# Patient Record
Sex: Male | Born: 2000 | Race: Black or African American | Hispanic: No | Marital: Single | Smoking: Never smoker
Health system: Southern US, Community
[De-identification: ages and names within clinical notes are randomized; demographics above are authoritative.]

## PROBLEM LIST (undated history)

## (undated) DIAGNOSIS — R7303 Prediabetes: Secondary | ICD-10-CM

## (undated) DIAGNOSIS — J45909 Unspecified asthma, uncomplicated: Secondary | ICD-10-CM

## (undated) DIAGNOSIS — G35D Multiple sclerosis, unspecified: Secondary | ICD-10-CM

## (undated) DIAGNOSIS — G35 Multiple sclerosis: Secondary | ICD-10-CM

## (undated) HISTORY — DX: Prediabetes: R73.03

---
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
## 2019-02-20 ENCOUNTER — Ambulatory Visit: Payer: Managed Care, Other (non HMO) | Attending: Internal Medicine

## 2019-02-20 DIAGNOSIS — Z20822 Contact with and (suspected) exposure to covid-19: Secondary | ICD-10-CM

## 2019-02-22 LAB — NOVEL CORONAVIRUS, NAA: SARS-CoV-2, NAA: NOT DETECTED

## 2019-07-14 ENCOUNTER — Ambulatory Visit: Payer: Managed Care, Other (non HMO) | Attending: Internal Medicine

## 2019-07-14 DIAGNOSIS — Z23 Encounter for immunization: Secondary | ICD-10-CM

## 2019-07-14 NOTE — Progress Notes (Signed)
   Covid-19 Vaccination Clinic  Name:  Nathaniel Fowler    MRN: [id number] DOB: [birth year]  07/14/2019  Nathaniel Fowler was observed post Covid-19 immunization for 15 minutes without incident. He was provided with Vaccine Information Sheet and instruction to access the V-Safe system.   Nathaniel Fowler was instructed to call 911 with any severe reactions post vaccine: Marland Kitchen Difficulty breathing  . Swelling of face and throat  . A fast heartbeat  . A bad rash all over body  . Dizziness and weakness   Immunizations Administered    Name Date Dose VIS Date Route   Pfizer COVID-19 Vaccine 07/14/2019 11:47 AM 0.3 mL 04/05/2018 Intramuscular   Manufacturer: [employer], [employer]   Lot: [id number]   NDC: [address]-2

## 2019-08-05 ENCOUNTER — Ambulatory Visit: Payer: Managed Care, Other (non HMO) | Attending: Internal Medicine

## 2019-08-05 DIAGNOSIS — Z23 Encounter for immunization: Secondary | ICD-10-CM

## 2019-08-05 NOTE — Progress Notes (Signed)
   Covid-19 Vaccination Clinic  Name:  Nathaniel Fowler    MRN: [id number] DOB: 24-Jan-2001  08/05/2019  [name] was observed post Covid-19 immunization for 15 minutes without incident. He was provided with Vaccine Information Sheet and instruction to access the V-Safe system.   [name] was instructed to call 911 with any severe reactions post vaccine: Marland Kitchen Difficulty breathing  . Swelling of face and throat  . A fast heartbeat  . A bad rash all over body  . Dizziness and weakness   Immunizations Administered    Name Date Dose VIS Date Route   Pfizer COVID-19 Vaccine 08/05/2019 10:40 AM 0.3 mL 04/05/2018 Intramuscular   Manufacturer: [employer], [employer]   Lot: [id number]   NDC: [address]-3

## 2020-02-12 ENCOUNTER — Other Ambulatory Visit: Payer: Self-pay

## 2020-02-12 ENCOUNTER — Other Ambulatory Visit: Payer: Managed Care, Other (non HMO)

## 2020-02-12 DIAGNOSIS — Z20822 Contact with and (suspected) exposure to covid-19: Secondary | ICD-10-CM

## 2020-02-13 LAB — SARS-COV-2, NAA 2 DAY TAT

## 2020-02-13 LAB — NOVEL CORONAVIRUS, NAA: SARS-CoV-2, NAA: NOT DETECTED

## 2020-03-17 ENCOUNTER — Emergency Department
Admission: EM | Admit: 2020-03-17 | Discharge: 2020-03-17 | Disposition: A | Discharge status: Home / Self Care | Payer: Managed Care, Other (non HMO) | Attending: Emergency Medicine | Admitting: Emergency Medicine

## 2020-03-17 ENCOUNTER — Emergency Department: Payer: Managed Care, Other (non HMO)

## 2020-03-17 ENCOUNTER — Other Ambulatory Visit: Payer: Self-pay

## 2020-03-17 ENCOUNTER — Encounter: Payer: Self-pay | Admitting: Emergency Medicine

## 2020-03-17 DIAGNOSIS — J45909 Unspecified asthma, uncomplicated: Secondary | ICD-10-CM | POA: Diagnosis not present

## 2020-03-17 DIAGNOSIS — R202 Paresthesia of skin: Secondary | ICD-10-CM | POA: Insufficient documentation

## 2020-03-17 LAB — COMPREHENSIVE METABOLIC PANEL
ALT: 30 U/L (ref 0–44)
AST: 19 U/L (ref 15–41)
Albumin: 3.9 g/dL (ref 3.5–5.0)
Alkaline Phosphatase: 102 U/L (ref 38–126)
Anion gap: 9 (ref 5–15)
BUN: 11 mg/dL (ref 6–20)
CO2: 23 mmol/L (ref 22–32)
Calcium: 9.1 mg/dL (ref 8.9–10.3)
Chloride: 106 mmol/L (ref 98–111)
Creatinine, Ser: 1.04 mg/dL (ref 0.61–1.24)
GFR, Estimated: >60 mL/min (ref >60)
Glucose, Bld: 134 mg/dL — ABNORMAL HIGH (ref 70–99)
Potassium: 3.8 mmol/L (ref 3.5–5.1)
Sodium: 138 mmol/L (ref 135–145)
Total Bilirubin: 0.5 mg/dL (ref 0.3–1.2)
Total Protein: 8.2 g/dL — ABNORMAL HIGH (ref 6.5–8.1)

## 2020-03-17 LAB — CBC
HCT: 42.5 % (ref 39.0–52.0)
Hemoglobin: 14.3 g/dL (ref 13.0–17.0)
MCH: 30 pg (ref 26.0–34.0)
MCHC: 33.6 g/dL (ref 30.0–36.0)
MCV: 89.1 fL (ref 80.0–100.0)
Platelets: 395 10*3/uL (ref 150–400)
RBC: 4.77 MIL/uL (ref 4.22–5.81)
RDW: 13.7 % (ref 11.5–15.5)
WBC: 8.1 10*3/uL (ref 4.0–10.5)
nRBC: 0 % (ref 0.0–0.2)

## 2020-03-17 MED ORDER — HYDROXYZINE HCL 10 MG PO TABS: 10.0000 mg | ORAL_TABLET | Freq: Three times a day (TID) | ORAL | 0 refills | Status: AC | PRN

## 2020-03-17 NOTE — ED Provider Notes (Signed)
[facility]-EMERGENCY DEPARTMENT  ____________________________________________  Time seen: Approximately 10:34 PM  I have reviewed the triage vital signs and the nursing notes.   HISTORY  Chief Complaint Numbness   Historian Patient    HPI Nathaniel Fowler is a 20 y.o. male presents to the emergency department with numbness and tingling along the perimeter of the tongue and along the lower lip.  Patient states that symptoms developed today.  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  He denies headache or neck pain.  No numbness or tingling in the upper and lower extremities.  No falls or traumas.  He has had no changes in his speech.  No difficulty swallowing and has been able to maintain his own secretions.  He has been afebrile.  No recent vaccinations.  No recent illness.  No other alleviating measures have been attempted.   Past Medical History:  Diagnosis Date  . Asthma   . Prediabetes      Immunizations up to date:  Yes.     Past Medical History:  Diagnosis Date  . Asthma   . Prediabetes     There are no problems to display for this patient.   History reviewed. No pertinent surgical history.  Prior to Admission medications   Medication Sig Start Date End Date Taking? Authorizing Provider  hydrOXYzine (ATARAX/VISTARIL) 10 MG tablet Take 1 tablet (10 mg total) by mouth 3 (three) times daily as needed for up to 7 days. 03/17/20 03/24/20 Yes Pia Mau M, PA-C  cetirizine (ZYRTEC) 10 MG tablet Take 10 mg by mouth daily.    [provider]  ondansetron (ZOFRAN ODT) 4 MG disintegrating tablet Take 1 tablet (4 mg total) by mouth every 8 (eight) hours as needed for nausea or vomiting. 03/14/16   Minna Antis, MD    Allergies Patient has no known allergies.  No family history on file.  Social History Social History   Tobacco Use  . Smoking status: Never Smoker   . Smokeless tobacco: Never Used  Substance Use Topics  . Alcohol use: No    Alcohol/week: 0.0 standard drinks  . Drug use: Never     Review of Systems  Constitutional: No fever/chills Eyes:  No discharge ENT: No upper respiratory complaints. Respiratory: no cough. No SOB/ use of accessory muscles to breath Gastrointestinal:   No nausea, no vomiting.  No diarrhea.  No constipation. Musculoskeletal: Negative for musculoskeletal pain. Neuro: Patient has tongue and lip paresthesia. Skin: Negative for rash, abrasions, lacerations, ecchymosis.    ____________________________________________   PHYSICAL EXAM:  VITAL SIGNS: ED Triage Vitals  Enc Vitals Group     BP 03/17/20 2118 (!) 147/86     Pulse Rate 03/17/20 2118 87     Resp 03/17/20 2118 20     Temp 03/17/20 2118 99.2 F (37.3 C)     Temp Source 03/17/20 2118 Oral     SpO2 03/17/20 2118 97 %     Weight 03/17/20 2110 240 lb (108.9 kg)     Height 03/17/20 2110 5\' 8"  (1.727 m)     Head Circumference --      Peak Flow --      Pain Score 03/17/20 2110 0     Pain Loc --      Pain Edu? --      Excl. in GC? --      Constitutional: Alert and oriented. Well appearing and in no  acute distress. Eyes: Conjunctivae are normal. PERRL. EOMI. Head: Atraumatic. ENT:      Nose: No congestion/rhinnorhea.      Mouth/Throat: Mucous membranes are moist.  Neck: No stridor.  No cervical spine tenderness to palpation. Cardiovascular: Normal rate, regular rhythm. Normal S1 and S2.  Good peripheral circulation. Respiratory: Normal respiratory effort without tachypnea or retractions. Lungs CTAB. Good air entry to the bases with no decreased or absent breath sounds Gastrointestinal: Bowel sounds x 4 quadrants. Soft and nontender to palpation. No guarding or rigidity. No distention. Musculoskeletal: Full range of motion to all extremities. No obvious deformities noted Neurologic:  Normal for age. No gross focal neurologic deficits are  appreciated.  Skin:  Skin is warm, dry and intact. No rash noted. Psychiatric: Mood and affect are normal for age. Speech and behavior are normal.   ____________________________________________   LABS (all labs ordered are listed, but only abnormal results are displayed)  Labs Reviewed  COMPREHENSIVE METABOLIC PANEL - Abnormal; Notable for the following components:      Result Value   Glucose, Bld 134 (*)    Total Protein 8.2 (*)    All other components within normal limits  CBC   ____________________________________________  EKG   ____________________________________________  RADIOLOGY Geraldo Pitter, personally viewed and evaluated these images (plain radiographs) as part of my medical decision making, as well as reviewing the written report by the radiologist.  CT Head Wo Contrast  Result Date: 03/17/2020 CLINICAL DATA:  Neuro deficit, acute, stroke suspected, mouth numbness x2 days EXAM: CT HEAD WITHOUT CONTRAST TECHNIQUE: Contiguous axial images were obtained from the base of the skull through the vertex without intravenous contrast. COMPARISON:  None. FINDINGS: Brain: No evidence of acute infarction, hemorrhage, hydrocephalus, extra-axial collection or mass lesion/mass effect. Incidentally noted cavum septum pellucidum and cavum vergae. Vascular: No hyperdense vessel or unexpected calcification. Skull: Normal. Negative for fracture or focal lesion. Sinuses/Orbits: No acute finding. Other: None. IMPRESSION: No acute intracranial pathology. Electronically Signed   By: Maudry Mayhew MD   On: 03/17/2020 21:39    ____________________________________________    PROCEDURES  Procedure(s) performed:     Procedures     Medications - No data to display   ____________________________________________   INITIAL IMPRESSION / ASSESSMENT AND PLAN / ED COURSE  Pertinent labs & imaging results that were available during my care of the patient were reviewed by me and  considered in my medical decision making (see chart for details).      Assessment and plan Paresthesia 20 year old male presents to the emergency department with paresthesias along the tongue and along the lower lip that started acutely tonight.  Patient was hypertensive at triage but vital signs were otherwise reassuring.  Neuro exam was appropriate without acute deficits.  Patient had normal sensation in the upper and lower extremities and had symmetric strength.  He was able to perform rapid alternating movements and had a negative Romberg.  CT head revealed no evidence of intracranial bleed or other acute abnormality.  Patient was advised to follow-up with neurology, Dr. Sheppard Penton.  He was started on hydroxyzine for anxiety.  Return precautions were given to return with new or worsening symptoms.     ____________________________________________  FINAL CLINICAL IMPRESSION(S) / ED DIAGNOSES  Final diagnoses:  Paresthesia      NEW MEDICATIONS STARTED DURING THIS VISIT:  ED Discharge Orders         Ordered    hydrOXYzine (ATARAX/VISTARIL) 10 MG tablet  3 times daily  PRN        03/17/20 2229              This chart was dictated using voice recognition software/Dragon. Despite best efforts to proofread, errors can occur which can change the meaning. Any change was purely unintentional.     Orvil Feil, PA-C 03/17/20 2239    Phineas Semen, MD 03/17/20 [id number]

## 2020-03-17 NOTE — ED Notes (Signed)
Pt verbalized understanding of d/c instructions at this time and denies further questions at this time. Pt ambulatory to lobby, NAD noted, steady gait noted at this time.

## 2020-03-17 NOTE — ED Notes (Signed)
Patient transported to CT 

## 2020-03-17 NOTE — Discharge Instructions (Signed)
Take hydroxyzine up to three times daily for the next seven days.

## 2020-03-17 NOTE — ED Notes (Signed)
Pt ambulatory to ED47A at this time. Pt A&Ox4, NAD noted. Pt stated numbness in tongue and L side of mouth starting Thursday. Pt denies trouble breathing or swallowing. Pt denies numbness or tingling anywhere else at this time.

## 2020-05-30 ENCOUNTER — Emergency Department: Payer: Managed Care, Other (non HMO)

## 2020-05-30 ENCOUNTER — Other Ambulatory Visit: Payer: Self-pay

## 2020-05-30 ENCOUNTER — Encounter: Payer: Self-pay | Admitting: Emergency Medicine

## 2020-05-30 ENCOUNTER — Inpatient Hospital Stay
Admission: EM | Admit: 2020-05-30 | Discharge: 2020-06-03 | DRG: 059 | Disposition: A | Discharge status: Home / Self Care | Payer: Managed Care, Other (non HMO) | Attending: Internal Medicine | Admitting: Internal Medicine

## 2020-05-30 DIAGNOSIS — E66813 Obesity, class 3: Secondary | ICD-10-CM

## 2020-05-30 DIAGNOSIS — J45909 Unspecified asthma, uncomplicated: Secondary | ICD-10-CM | POA: Diagnosis present

## 2020-05-30 DIAGNOSIS — Z832 Family history of diseases of the blood and blood-forming organs and certain disorders involving the immune mechanism: Secondary | ICD-10-CM

## 2020-05-30 DIAGNOSIS — R7303 Prediabetes: Secondary | ICD-10-CM | POA: Diagnosis present

## 2020-05-30 DIAGNOSIS — R253 Fasciculation: Secondary | ICD-10-CM

## 2020-05-30 DIAGNOSIS — Z8249 Family history of ischemic heart disease and other diseases of the circulatory system: Secondary | ICD-10-CM

## 2020-05-30 DIAGNOSIS — R531 Weakness: Secondary | ICD-10-CM

## 2020-05-30 DIAGNOSIS — G35 Multiple sclerosis: Secondary | ICD-10-CM | POA: Diagnosis not present

## 2020-05-30 DIAGNOSIS — G35D Multiple sclerosis, unspecified: Secondary | ICD-10-CM

## 2020-05-30 DIAGNOSIS — R29810 Facial weakness: Secondary | ICD-10-CM | POA: Diagnosis present

## 2020-05-30 DIAGNOSIS — Z833 Family history of diabetes mellitus: Secondary | ICD-10-CM

## 2020-05-30 DIAGNOSIS — G360 Neuromyelitis optica [Devic]: Secondary | ICD-10-CM | POA: Diagnosis present

## 2020-05-30 DIAGNOSIS — R9389 Abnormal findings on diagnostic imaging of other specified body structures: Secondary | ICD-10-CM

## 2020-05-30 DIAGNOSIS — Z83438 Family history of other disorder of lipoprotein metabolism and other lipidemia: Secondary | ICD-10-CM

## 2020-05-30 DIAGNOSIS — Z20822 Contact with and (suspected) exposure to covid-19: Secondary | ICD-10-CM | POA: Diagnosis present

## 2020-05-30 DIAGNOSIS — R200 Anesthesia of skin: Secondary | ICD-10-CM

## 2020-05-30 DIAGNOSIS — Z6841 Body Mass Index (BMI) 40.0 and over, adult: Secondary | ICD-10-CM

## 2020-05-30 LAB — CBC WITH DIFFERENTIAL/PLATELET
Abs Immature Granulocytes: 0.01 10*3/uL (ref 0.00–0.07)
Basophils Absolute: 0 10*3/uL (ref 0.0–0.1)
Basophils Relative: 0 %
Eosinophils Absolute: 0 10*3/uL (ref 0.0–0.5)
Eosinophils Relative: 1 %
HCT: 42.6 % (ref 39.0–52.0)
Hemoglobin: 14.6 g/dL (ref 13.0–17.0)
Immature Granulocytes: 0 %
Lymphocytes Relative: 34 %
Lymphs Abs: 2.1 10*3/uL (ref 0.7–4.0)
MCH: 30 pg (ref 26.0–34.0)
MCHC: 34.3 g/dL (ref 30.0–36.0)
MCV: 87.5 fL (ref 80.0–100.0)
Monocytes Absolute: 0.6 10*3/uL (ref 0.1–1.0)
Monocytes Relative: 9 %
Neutro Abs: 3.5 10*3/uL (ref 1.7–7.7)
Neutrophils Relative %: 56 %
Platelets: 413 10*3/uL — ABNORMAL HIGH (ref 150–400)
RBC: 4.87 MIL/uL (ref 4.22–5.81)
RDW: 13.8 % (ref 11.5–15.5)
WBC: 6.2 10*3/uL (ref 4.0–10.5)
nRBC: 0 % (ref 0.0–0.2)

## 2020-05-30 LAB — BASIC METABOLIC PANEL
Anion gap: 9 (ref 5–15)
BUN: 11 mg/dL (ref 6–20)
CO2: 23 mmol/L (ref 22–32)
Calcium: 9.7 mg/dL (ref 8.9–10.3)
Chloride: 108 mmol/L (ref 98–111)
Creatinine, Ser: 0.96 mg/dL (ref 0.61–1.24)
GFR, Estimated: >60 mL/min (ref >60)
Glucose, Bld: 97 mg/dL (ref 70–99)
Potassium: 3.9 mmol/L (ref 3.5–5.1)
Sodium: 140 mmol/L (ref 135–145)

## 2020-05-30 NOTE — ED Notes (Signed)
ED Provider at bedside. 

## 2020-05-30 NOTE — ED Notes (Signed)
D/w Dr. Derrill Kay. Per Dr. Derrill Kay, no orders at this time.

## 2020-05-30 NOTE — ED Notes (Signed)
Pt transported to MRI 

## 2020-05-30 NOTE — ED Provider Notes (Signed)
[facility] Emergency Department Provider Note  ____________________________________________   I have reviewed the triage vital signs and the nursing notes.   HISTORY  Chief Complaint Facial Droop (L)   History limited by: Not Limited   HPI Nathaniel Fowler is a 20 y.o. male who presents to the emergency department today because of concerns for left facial droop.  The patient states that he has noticed a change to his left face over the past couple of days but the mother just noticed it today.  He states that a few months ago he had some left facial numbness.  Mother and patient also noticed today that he has had significant twitching of his left lower eyelid as well as left mouth.  Patient denies any pain.  Denies any change in taste or hearing.  Denies any change in his vision. No trauma.   Records reviewed. Per medical record review patient has a history of asthma.   Past Medical History:  Diagnosis Date  . Asthma   . Prediabetes     There are no problems to display for this patient.   History reviewed. No pertinent surgical history.  Prior to Admission medications   Medication Sig Start Date End Date Taking? Authorizing Provider  cetirizine (ZYRTEC) 10 MG tablet Take 10 mg by mouth daily.    [provider]  ondansetron (ZOFRAN ODT) 4 MG disintegrating tablet Take 1 tablet (4 mg total) by mouth every 8 (eight) hours as needed for nausea or vomiting. 03/14/16   Minna Antis, MD    Allergies Patient has no known allergies.  History reviewed. No pertinent family history.  Social History Social History   Tobacco Use  . Smoking status: Never Smoker  . Smokeless tobacco: Never Used  Substance Use Topics  . Alcohol use: No    Alcohol/week: 0.0 standard drinks  . Drug use: Never    Review of Systems Constitutional: No fever/chills Eyes: No visual changes. ENT: No sore throat. Cardiovascular: Denies chest  pain. Respiratory: Denies shortness of breath. Gastrointestinal: No abdominal pain.  No nausea, no vomiting.  No diarrhea.   Genitourinary: Negative for dysuria. Musculoskeletal: Negative for back pain. Skin: Negative for rash. Neurological: Positive for left facial droop and numbness.  ____________________________________________   PHYSICAL EXAM:  VITAL SIGNS: ED Triage Vitals  Enc Vitals Group     BP 05/30/20 2017 (!) 147/76     Pulse Rate 05/30/20 2017 80     Resp 05/30/20 2017 18     Temp 05/30/20 2017 99.4 F (37.4 C)     Temp Source 05/30/20 2017 Oral     SpO2 05/30/20 2017 97 %     Weight 05/30/20 2016 (!) 320 lb (145.2 kg)     Height 05/30/20 2016 5\' 8"  (1.727 m)     Head Circumference --      Peak Flow --      Pain Score 05/30/20 2015 0   Constitutional: Alert and oriented.  Eyes: Conjunctivae are normal.  ENT      Head: Normocephalic and atraumatic.      Nose: No congestion/rhinnorhea.      Mouth/Throat: Mucous membranes are moist.      Neck: No stridor. Hematological/Lymphatic/Immunilogical: No cervical lymphadenopathy. Cardiovascular: Normal rate, regular rhythm.  No murmurs, rubs, or gallops.  Respiratory: Normal respiratory effort without tachypnea nor retractions. Breath sounds are clear and equal bilaterally. No wheezes/rales/rhonchi. Gastrointestinal: Soft and non tender. No rebound. No guarding.  Genitourinary: Deferred Musculoskeletal: Normal  range of motion in all extremities. No lower extremity edema. Neurologic:  Left facial droop. Fasciculations of the left lower eye lid and left mouth.  Skin:  Skin is warm, dry and intact. No rash noted. Psychiatric: Mood and affect are normal. Speech and behavior are normal. Patient exhibits appropriate insight and judgment.  ____________________________________________    LABS (pertinent positives/negatives)  CBC wbc 6.2, hgb 14.6, plt 413 BMP  wnl ____________________________________________   EKG  None  ____________________________________________    RADIOLOGY  MR brain Multifocal hyperintense T2-weighted signal within the  supratentorial white matter and left middle cerebellar peduncle,  which may be due to changes of early chronic small vessel ischemia.  Demyelination is less likely as the distribution is not typical. If  there is clinical suspicion for demyelinating disease, postcontrast  imaging might be helpful.   ____________________________________________   PROCEDURES  Procedures  ____________________________________________   INITIAL IMPRESSION / ASSESSMENT AND PLAN / ED COURSE  Pertinent labs & imaging results that were available during my care of the patient were reviewed by me and considered in my medical decision making (see chart for details).   Patient presented to the emergency department today because of concerns for left facial weakness as well as muscle twitching.  On exam patient does have some obvious left facial weakness.  He also hasfasciculations of the muscles of the left face.  Given this abnormality and the fact that he also had some left facial numbness few months ago an MRI of the brain was obtained.  This was concerning for possible early chronic small vessel ischemia or demyelination in the supratentorial white matter and left middle cerebral peduncle.  I do think patient would benefit from further imaging and neurology evaluation.  Will plan on admission.  Discussed with patient and family.  ____________________________________________   FINAL CLINICAL IMPRESSION(S) / ED DIAGNOSES  Final diagnoses:  Left facial numbness  Abnormal MRI     Note: This dictation was prepared with Dragon dictation. Any transcriptional errors that result from this process are unintentional     Phineas Semen, MD 05/31/20 0009

## 2020-05-30 NOTE — ED Triage Notes (Addendum)
Pt arrived via POV with reports of L side facial droop that started a "few" days ago per pt along with eye twitching on the L side. Pt states he did not notice his face was drooping until yesterday.  Pt denies any other sxs, denies any headaches, vision loss or changes, denies any aphasia, denies any numbness or tingling in extremities. Pt able to close both eyes evenly and raise both eye brows evenly. No other facial asymmetry noted at this time.  PT is alert and oriented x 4 on arrival.

## 2020-05-30 NOTE — ED Provider Notes (Incomplete)
[facility] Emergency Department Provider Note  {** REMINDER - THIS NOTE IS NOT A FINAL MEDICAL RECORD UNTIL IT IS SIGNED.  UNTIL THEN, THE CONTENT BELOW MAY REFLECT INFORMATION FROM A DOCUMENTATION TEMPLATE, NOT THE ACTUAL PATIENT VISIT. **}  ____________________________________________   I have reviewed the triage vital signs and the nursing notes.   HISTORY  Chief Complaint Facial Droop (L)   History limited by: Not Limited   HPI Nathaniel Fowler is a 20 y.o. male who presents to the emergency department today because of concerns for left facial droop.  The patient states that he has noticed a change to his left face over the past couple of days but the mother just noticed it today.  He states that a few months ago he had some left facial numbness.  Mother and patient also noticed today that he has had significant twitching of his left lower eyelid as well as left mouth.  Patient denies any pain.  Denies any change in taste or hearing.  Denies any change in his vision. No trauma.   Records reviewed. Per medical record review patient has a history of asthma.   Past Medical History:  Diagnosis Date  . Asthma   . Prediabetes     There are no problems to display for this patient.   History reviewed. No pertinent surgical history.  Prior to Admission medications   Medication Sig Start Date End Date Taking? Authorizing Provider  cetirizine (ZYRTEC) 10 MG tablet Take 10 mg by mouth daily.    [provider]  ondansetron (ZOFRAN ODT) 4 MG disintegrating tablet Take 1 tablet (4 mg total) by mouth every 8 (eight) hours as needed for nausea or vomiting. 03/14/16   Minna Antis, MD    Allergies Patient has no known allergies.  History reviewed. No pertinent family history.  Social History Social History   Tobacco Use  . Smoking status: Never Smoker  . Smokeless tobacco: Never Used  Substance Use Topics  . Alcohol use: No     Alcohol/week: 0.0 standard drinks  . Drug use: Never    Review of Systems Constitutional: No fever/chills Eyes: No visual changes. ENT: No sore throat. Cardiovascular: Denies chest pain. Respiratory: Denies shortness of breath. Gastrointestinal: No abdominal pain.  No nausea, no vomiting.  No diarrhea.   Genitourinary: Negative for dysuria. Musculoskeletal: Negative for back pain. Skin: Negative for rash. Neurological: Positive for left facial droop and numbness.  ____________________________________________   PHYSICAL EXAM:  VITAL SIGNS: ED Triage Vitals  Enc Vitals Group     BP 05/30/20 2017 (!) 147/76     Pulse Rate 05/30/20 2017 80     Resp 05/30/20 2017 18     Temp 05/30/20 2017 99.4 F (37.4 C)     Temp Source 05/30/20 2017 Oral     SpO2 05/30/20 2017 97 %     Weight 05/30/20 2016 (!) 320 lb (145.2 kg)     Height 05/30/20 2016 5\' 8"  (1.727 m)     Head Circumference --      Peak Flow --      Pain Score 05/30/20 2015 0   Constitutional: Alert and oriented.  Eyes: Conjunctivae are normal.  ENT      Head: Normocephalic and atraumatic.      Nose: No congestion/rhinnorhea.      Mouth/Throat: Mucous membranes are moist.      Neck: No stridor. Hematological/Lymphatic/Immunilogical: No cervical lymphadenopathy. Cardiovascular: Normal rate, regular rhythm.  No murmurs,  rubs, or gallops.  Respiratory: Normal respiratory effort without tachypnea nor retractions. Breath sounds are clear and equal bilaterally. No wheezes/rales/rhonchi. Gastrointestinal: Soft and non tender. No rebound. No guarding.  Genitourinary: Deferred Musculoskeletal: Normal range of motion in all extremities. No lower extremity edema. Neurologic:  Left facial droop. Fasciculations of the left lower eye lid and left mouth.  Skin:  Skin is warm, dry and intact. No rash noted. Psychiatric: Mood and affect are normal. Speech and behavior are normal. Patient exhibits appropriate insight and  judgment.  ____________________________________________    LABS (pertinent positives/negatives)  CBC wbc 6.2, hgb 14.6, plt 413 BMP wnl ____________________________________________   EKG  None  ____________________________________________    RADIOLOGY  MR brain  {**I, Phineas Semen, personally viewed and evaluated these images (plain radiographs) as part of my medical decision making.**} ____________________________________________   PROCEDURES  Procedures  ____________________________________________   INITIAL IMPRESSION / ASSESSMENT AND PLAN / ED COURSE  Pertinent labs & imaging results that were available during my care of the patient were reviewed by me and considered in my medical decision making (see chart for details).   ***  Discussed with patient/family results of testing/physical exam, differential plan and return precautions.  ____________________________________________   FINAL CLINICAL IMPRESSION(S) / ED DIAGNOSES  Final diagnoses:  None     Note: This dictation was prepared with Dragon dictation. Any transcriptional errors that result from this process are unintentional

## 2020-05-31 ENCOUNTER — Observation Stay: Payer: Managed Care, Other (non HMO)

## 2020-05-31 DIAGNOSIS — G35 Multiple sclerosis: Secondary | ICD-10-CM

## 2020-05-31 DIAGNOSIS — Z8249 Family history of ischemic heart disease and other diseases of the circulatory system: Secondary | ICD-10-CM | POA: Diagnosis not present

## 2020-05-31 DIAGNOSIS — Z832 Family history of diseases of the blood and blood-forming organs and certain disorders involving the immune mechanism: Secondary | ICD-10-CM | POA: Diagnosis not present

## 2020-05-31 DIAGNOSIS — R200 Anesthesia of skin: Secondary | ICD-10-CM

## 2020-05-31 DIAGNOSIS — Z6841 Body Mass Index (BMI) 40.0 and over, adult: Secondary | ICD-10-CM | POA: Diagnosis not present

## 2020-05-31 DIAGNOSIS — R253 Fasciculation: Secondary | ICD-10-CM

## 2020-05-31 DIAGNOSIS — E66813 Obesity, class 3: Secondary | ICD-10-CM

## 2020-05-31 DIAGNOSIS — J45909 Unspecified asthma, uncomplicated: Secondary | ICD-10-CM | POA: Diagnosis present

## 2020-05-31 DIAGNOSIS — R531 Weakness: Secondary | ICD-10-CM | POA: Diagnosis present

## 2020-05-31 DIAGNOSIS — Z20822 Contact with and (suspected) exposure to covid-19: Secondary | ICD-10-CM | POA: Diagnosis present

## 2020-05-31 DIAGNOSIS — Z83438 Family history of other disorder of lipoprotein metabolism and other lipidemia: Secondary | ICD-10-CM | POA: Diagnosis not present

## 2020-05-31 DIAGNOSIS — R7303 Prediabetes: Secondary | ICD-10-CM | POA: Diagnosis present

## 2020-05-31 DIAGNOSIS — Z833 Family history of diabetes mellitus: Secondary | ICD-10-CM | POA: Diagnosis not present

## 2020-05-31 DIAGNOSIS — G360 Neuromyelitis optica [Devic]: Secondary | ICD-10-CM | POA: Diagnosis present

## 2020-05-31 DIAGNOSIS — R29810 Facial weakness: Secondary | ICD-10-CM | POA: Diagnosis present

## 2020-05-31 DIAGNOSIS — G379 Demyelinating disease of central nervous system, unspecified: Secondary | ICD-10-CM | POA: Diagnosis not present

## 2020-05-31 LAB — CBC
HCT: 41.5 % (ref 39.0–52.0)
Hemoglobin: 13.9 g/dL (ref 13.0–17.0)
MCH: 29.5 pg (ref 26.0–34.0)
MCHC: 33.5 g/dL (ref 30.0–36.0)
MCV: 88.1 fL (ref 80.0–100.0)
Platelets: 406 10*3/uL — ABNORMAL HIGH (ref 150–400)
RBC: 4.71 MIL/uL (ref 4.22–5.81)
RDW: 13.8 % (ref 11.5–15.5)
WBC: 5.4 10*3/uL (ref 4.0–10.5)
nRBC: 0 % (ref 0.0–0.2)

## 2020-05-31 LAB — URINE DRUG SCREEN, QUALITATIVE (ARMC ONLY)
Amphetamines, Ur Screen: NOT DETECTED
Barbiturates, Ur Screen: NOT DETECTED
Benzodiazepine, Ur Scrn: NOT DETECTED
Cannabinoid 50 Ng, Ur ~~LOC~~: NOT DETECTED
Cocaine Metabolite,Ur ~~LOC~~: NOT DETECTED
MDMA (Ecstasy)Ur Screen: NOT DETECTED
Methadone Scn, Ur: NOT DETECTED
Opiate, Ur Screen: NOT DETECTED
Phencyclidine (PCP) Ur S: NOT DETECTED
Tricyclic, Ur Screen: NOT DETECTED

## 2020-05-31 LAB — BASIC METABOLIC PANEL
Anion gap: 10 (ref 5–15)
BUN: 10 mg/dL (ref 6–20)
CO2: 25 mmol/L (ref 22–32)
Calcium: 9.5 mg/dL (ref 8.9–10.3)
Chloride: 106 mmol/L (ref 98–111)
Creatinine, Ser: 0.97 mg/dL (ref 0.61–1.24)
GFR, Estimated: >60 mL/min (ref >60)
Glucose, Bld: 132 mg/dL — ABNORMAL HIGH (ref 70–99)
Potassium: 3.3 mmol/L — ABNORMAL LOW (ref 3.5–5.1)
Sodium: 141 mmol/L (ref 135–145)

## 2020-05-31 LAB — TSH: TSH: 1.995 u[IU]/mL (ref 0.350–4.500)

## 2020-05-31 LAB — SARS CORONAVIRUS 2 (TAT 6-24 HRS): SARS Coronavirus 2: NEGATIVE

## 2020-05-31 LAB — CK: Total CK: 271 U/L (ref 49–397)

## 2020-05-31 LAB — MAGNESIUM: Magnesium: 2 mg/dL (ref 1.7–2.4)

## 2020-05-31 LAB — HIV ANTIBODY (ROUTINE TESTING W REFLEX): HIV Screen 4th Generation wRfx: NONREACTIVE

## 2020-05-31 MED ORDER — SODIUM CHLORIDE 0.9 % IV SOLN
1000.0000 mg | INTRAVENOUS | Status: DC
  Administered 2020-05-31: 1000 mg via INTRAVENOUS
  Filled 2020-05-31: qty 8

## 2020-05-31 MED ORDER — SODIUM CHLORIDE 0.9 % IV SOLN
1000.0000 mg | INTRAVENOUS | Status: AC
  Administered 2020-06-01 – 2020-06-03 (×4): 1000 mg via INTRAVENOUS
  Filled 2020-05-31 (×4): qty 8

## 2020-05-31 MED ORDER — PANTOPRAZOLE SODIUM 40 MG PO TBEC
40.0000 mg | DELAYED_RELEASE_TABLET | Freq: Every day | ORAL | Status: DC
  Administered 2020-06-01 – 2020-06-03 (×3): 40 mg via ORAL
  Filled 2020-05-31 (×3): qty 1

## 2020-05-31 MED ORDER — SODIUM CHLORIDE 0.9 % IV SOLN
INTRAVENOUS | Status: DC | PRN
  Administered 2020-05-31: 11:00:00 250 mL via INTRAVENOUS
  Administered 2020-06-02: 50 mL via INTRAVENOUS

## 2020-05-31 MED ORDER — GADOBUTROL 1 MMOL/ML IV SOLN
10.0000 mL | Freq: Once | INTRAVENOUS | Status: AC | PRN
  Administered 2020-05-31: 10 mL via INTRAVENOUS

## 2020-05-31 MED ORDER — PANTOPRAZOLE SODIUM 40 MG PO TBEC
40.0000 mg | DELAYED_RELEASE_TABLET | Freq: Every day | ORAL | Status: DC
  Administered 2020-05-31: 40 mg via ORAL
  Filled 2020-05-31: qty 1

## 2020-05-31 MED ORDER — ENOXAPARIN SODIUM 80 MG/0.8ML ~~LOC~~ SOLN
0.5000 mg/kg | SUBCUTANEOUS | Status: DC
  Filled 2020-05-31: qty 0.8

## 2020-05-31 MED ORDER — ENOXAPARIN SODIUM 80 MG/0.8ML ~~LOC~~ SOLN
0.5000 mg/kg | SUBCUTANEOUS | Status: DC
  Administered 2020-06-01 – 2020-06-02 (×2): 67.5 mg via SUBCUTANEOUS
  Filled 2020-05-31 (×3): qty 0.8

## 2020-05-31 NOTE — Progress Notes (Signed)
PHARMACIST - PHYSICIAN COMMUNICATION  CONCERNING:  Enoxaparin (Lovenox) for DVT Prophylaxis    RECOMMENDATION: Patient was prescribed enoxaprin 40mg  q24 hours for VTE prophylaxis.   Filed Weights   05/30/20 2016  Weight: (!) 145.2 kg (320 lb)    Body mass index is 48.66 kg/m.  Estimated Creatinine Clearance: 172 mL/min (by C-G formula based on SCr of 0.96 mg/dL).   Based on [facility] policy patient is candidate for enoxaparin 0.5mg /kg TBW SQ every 24 hours based on BMI being >30.  DESCRIPTION: Pharmacy has adjusted enoxaparin dose per [facility] policy.  Patient is now receiving enoxaparin 0.5 mg/kg every 24 hours   [facility], PharmD, [facility] 05/31/2020 12:41 AM

## 2020-05-31 NOTE — ED Notes (Signed)
Discussed plan for admission with patient. Patient aware that mother is unable to stay overnight due to visiting hours and states that he feels comfortable going to floor without mom. Patient mother aware and asked if she is able to transport pt to room then leave afterwards. RN messaged inpatient RN to see if this was okay.

## 2020-05-31 NOTE — Progress Notes (Signed)
[facility]  - [place] [facility]   PATIENT NAME: Nathaniel Fowler    MR#:  [id number]  DATE OF BIRTH:  [birth year]  SUBJECTIVE:  patient came in with numbness on the left face with left eye twitching and facial droop. Feels bit better. Patient's mother and grandmother at bedside. Denies any focal weakness in upper lower extremity  REVIEW OF SYSTEMS:   Review of Systems  Constitutional: Negative for chills, fever and weight loss.  HENT: Negative for ear discharge, ear pain and nosebleeds.   Eyes: Negative for blurred vision, pain and discharge.  Respiratory: Negative for sputum production, shortness of breath, wheezing and stridor.   Cardiovascular: Negative for chest pain, palpitations, orthopnea and PND.  Gastrointestinal: Negative for abdominal pain, diarrhea, nausea and vomiting.  Genitourinary: Negative for frequency and urgency.  Musculoskeletal: Negative for back pain and joint pain.  Neurological: Positive for weakness. Negative for sensory change, speech change and focal weakness.  Psychiatric/Behavioral: Negative for depression and hallucinations. The patient is not nervous/anxious.    Tolerating Diet:yes Tolerating PT: not needed  DRUG ALLERGIES:  No Known Allergies  VITALS:  Blood pressure 120/76, pulse 79, temperature 98.8 F (37.1 C), temperature source Tympanic, resp. rate 16, height 5\' 8"  (1.727 m), weight (!) 136.7 kg, SpO2 99 %.  PHYSICAL EXAMINATION:   Physical Exam  GENERAL:  20 y.o.-year-old patient lying in the bed with no acute distress.  Obese HEENT: Head atraumatic, normocephalic. Oropharynx and nasopharynx clear.  LUNGS: Normal breath sounds bilaterally, no wheezing, rales, rhonchi. No use of accessory muscles of respiration.  CARDIOVASCULAR: S1, S2 normal. No murmurs, rubs, or gallops.  ABDOMEN: Soft, nontender, nondistended. Bowel sounds present. No organomegaly or mass.  EXTREMITIES: No cyanosis, clubbing or edema b/l.     NEUROLOGIC: Cranial nerves II through XII are intact. No focal Motor or sensory deficits b/l.   PSYCHIATRIC:  patient is alert and oriented x 3.  SKIN: No obvious rash, lesion, or ulcer.   LABORATORY PANEL:  CBC Recent Labs  Lab 05/31/20 0433  WBC 5.4  HGB 13.9  HCT 41.5  PLT 406*    Chemistries  Recent Labs  Lab 05/30/20 2232 05/31/20 0433  NA 140 141  K 3.9 3.3*  CL 108 106  CO2 23 25  GLUCOSE 97 132*  BUN 11 10  CREATININE 0.96 0.97  CALCIUM 9.7 9.5  MG 2.0  --    Cardiac Enzymes No results for input(s): TROPONINI in the last 168 hours. RADIOLOGY:  MR BRAIN WO CONTRAST  Result Date: 05/30/2020 CLINICAL DATA:  Left facial weakness EXAM: MRI HEAD WITHOUT CONTRAST TECHNIQUE: Multiplanar, multiecho pulse sequences of the brain and surrounding structures were obtained without intravenous contrast. COMPARISON:  None. FINDINGS: Brain: No acute infarct, mass effect or extra-axial collection. No acute or chronic hemorrhage. There is multifocal hyperintense T2-weighted signal within the supratentorial white matter and within the left middle cerebellar peduncle. The midline structures are normal. Vascular: Major flow voids are preserved. Skull and upper cervical spine: Normal calvarium and skull base. Visualized upper cervical spine and soft tissues are normal. Sinuses/Orbits:No paranasal sinus fluid levels or advanced mucosal thickening. No mastoid or middle ear effusion. Normal orbits. IMPRESSION: 1. No acute intracranial abnormality. 2. Multifocal hyperintense T2-weighted signal within the supratentorial white matter and left middle cerebellar peduncle, which may be due to changes of early chronic small vessel ischemia. Demyelination is less likely as the distribution is not typical. If there is clinical suspicion for demyelinating disease, postcontrast  imaging might be helpful. Electronically Signed   By: Deatra Robinson M.D.   On: 05/30/2020 23:16   MR BRAIN W CONTRAST  Result  Date: 05/31/2020 CLINICAL DATA:  Left facial numbness and twitching EXAM: MRI HEAD WITH CONTRAST TECHNIQUE: Multiplanar, multiecho pulse sequences of the brain and surrounding structures were obtained with intravenous contrast. CONTRAST:  24mL GADAVIST GADOBUTROL 1 MMOL/ML IV SOLN COMPARISON:  Brain MRI without contrast 05/30/2020 FINDINGS: There are 4-5 punctate contrast-enhancing foci within the supratentorial white matter. No enhancing lesion in the cerebellum. There is a punctate focus of enhancement in the ventral left medulla oblongata. IMPRESSION: Multiple punctate contrast-enhancing foci within the supratentorial white matter and brainstem. Acute demyelination is the primary consideration. Possibilities include multiple sclerosis and neuromyelitis spectrum disorders Electronically Signed   By: Deatra Robinson M.D.   On: 05/31/2020 01:34   ASSESSMENT AND PLAN:  Nathaniel Fowler is a 20 y.o. male with no significant past medical history who presents with concerns of persistent twitching beneath his left eye and left lip. For the past 4 days, he has noticed persistent twitching beneath his left eye and left lip and has noted some left facial drooping.    Fasciculation of the left upper lip/beneath left eye w/abnormal MRI finding s/o Demyelination suspect Multiple sclerosis--new dx -- MRI w/contrast multifocal hyperintense T2 weighted signal within the supratentorial white matter and left middle cerebellar peduncle.  Postcontrast imaging with multiple punctate contrast-enhancing foci within the supratentorial white matter and brainstem. --Neurology consult with Dr Maurene Capes. Patient started on high dose of IV Solu-Medrol. He will need five days of treatment. -- MRI cervical and lumbar spine ordered -- pt will follow-up with Dr. Cristopher Peru neurology as outpatient on May 16  Morbid Obesity Family is working on improving diet  DVT prophylaxis:.Lovenox Code Status: Full Family  Communication: Plan discussed with patient and mom at bedside  disposition Plan: Home  Consults called:   Level of care: Med-Surg Status is: Inpatient  Remains inpatient appropriate because:IV treatments appropriate due to intensity of illness or inability to take PO and Inpatient level of care appropriate due to severity of illness   Dispo: The patient is from: Home              Anticipated d/c is to: Home              Patient currently is not medically stable to d/c.   Difficult to place patient No   Patient is diagnosed with multiple sclerosis. Will stay in the hospital few days to get a high dose of IV steroid.     TOTAL TIME TAKING CARE OF THIS PATIENT: 30 minutes.  >50% time spent on counselling and coordination of care  Note: This dictation was prepared with Dragon dictation along with smaller phrase technology. Any transcriptional errors that result from this process are unintentional.  Enedina Finner M.D    [facility]   CC: Primary care physician; Renaee Munda, MD Patient ID: Burnice Logan, male   DOB: 05/22/2000, 19 y.o.   MRN: [id number]

## 2020-05-31 NOTE — Plan of Care (Signed)
  Problem: Education: Goal: Knowledge of General Education information will improve Description Including pain rating scale, medication(s)/side effects and non-pharmacologic comfort measures Outcome: Progressing   

## 2020-05-31 NOTE — ED Notes (Signed)
Patient out of room in imaging. Pt mother updated on room assignment. Patient mother aware of visiting hours on the floor. Pt mother inquiring if she is able to stay overnight with patient. Floor RN messaged and spoke with charge RN. Patient mother unable to stay. Patient mother aware but still insisting that she needs to stay. RN to discuss plan of care when pt return from MRI.

## 2020-05-31 NOTE — ED Notes (Signed)
Hospitalist at bedside 

## 2020-05-31 NOTE — ED Notes (Signed)
Pt transported to MRI 

## 2020-05-31 NOTE — H&P (Signed)
History and Physical    Nathaniel Fowler OHY:[id number] DOB: [birth year] DOA: 05/30/2020  PCP: Renaee Munda, MD  Patient coming from: Home, mom at bedside  I have personally briefly reviewed patient's old medical records in [facility]  Chief Complaint: Twitching under left eye and lip  HPI: Nathaniel Fowler is a 20 y.o. male with no significant past medical history who presents with concerns of persistent twitching beneath his left eye and left lip.  For the past 4 days, he has noticed persistent twitching beneath his left eye and left lip and has noted some left facial drooping.  No aggravating or alleviating factors.  He denies any numbness or tingling.  No extremity weakness or numbness.  No vision changes or hearing changes.  No headache.  No speech difficulty or dysphagia.  No rash, lesion or tick bite.  No recent travel.  He denies any tobacco, alcohol or illicit drug use. Does not take any prescription or medication or over-the-counter supplementation.  Patient denies being a vegan.  Has complained of fatigue but does not sleep well as he is a Archivist and currently Pharmacist, community.  Denies any sensation of hot or cold. Family hx only significant for HTN and Type 2 diabetes.   ED Course: He was afebrile, normotensive on room air.  CBC and BMP unremarkable.  However MRI of the brain Without contrast showed multifocal hyperintensity within the supratentorial white matter and left middle cerebral peduncle that could be due to changes of early chronic small vessel ischemia.  Demyelination less typical but contrast MRI recommended.  Review of Systems: Constitutional: No Weight Change, No Fever ENT/Mouth: No sore throat, No Rhinorrhea Eyes: No Eye Pain, No Vision Changes Cardiovascular: No Chest Pain, no SOB Respiratory: No Cough, No Sputum, Gastrointestinal: No Nausea, No Vomiting, No Diarrhea, No Constipation, No Pain Genitourinary: no Urinary  Incontinence, Musculoskeletal: No Arthralgias, No Myalgias Skin: No Skin Lesions, No Pruritus, Neuro: no Weakness, No Numbness Psych: No Anxiety/Panic, No Depression, no decrease appetite Heme/Lymph: No Bruising, No Bleeding Past Medical History:  Diagnosis Date  . Asthma   . Prediabetes     Surgical hx wisdom teeth removal   reports that he has never smoked. He has never used smokeless tobacco. He reports that he does not drink alcohol and does not use drugs. Social History  No Known Allergies  Family history Mother- hypertension, type 2 diabetes, congestive heart failure, hyperlipidemia Dad-sickle cell trait, CAD -deceased from MI   Prior to Admission medications   Medication Sig Start Date End Date Taking? Authorizing Provider  cetirizine (ZYRTEC) 10 MG tablet Take 10 mg by mouth daily.    [provider]  ondansetron (ZOFRAN ODT) 4 MG disintegrating tablet Take 1 tablet (4 mg total) by mouth every 8 (eight) hours as needed for nausea or vomiting. 03/14/16   Minna Antis, MD    Physical Exam: Vitals:   05/30/20 2016 05/30/20 2017 05/30/20 2348  BP:  (!) 147/76 130/68  Pulse:  80 77  Resp:  18 18  Temp:  99.4 F (37.4 C) 98.8 F (37.1 C)  TempSrc:  Oral Oral  SpO2:  97% 99%  Weight: (!) 145.2 kg    Height: 5\' 8"  (1.727 m)      Constitutional: NAD, calm, comfortable, obese young male laying flat in bed  Vitals:   05/30/20 2016 05/30/20 2017 05/30/20 2348  BP:  (!) 147/76 130/68  Pulse:  80 77  Resp:  18 18  Temp:  99.4 F (37.4 C) 98.8 F (37.1 C)  TempSrc:  Oral Oral  SpO2:  97% 99%  Weight: (!) 145.2 kg    Height: 5\' 8"  (1.727 m)     Eyes: PERRL, lids and conjunctivae normal wearing glasses ENMT: Mucous membranes are moist. Normal dentition.  Neck: normal, supple Respiratory: clear to auscultation bilaterally, no wheezing, no crackles. Normal respiratory effort. No accessory muscle use.  Cardiovascular: Regular rate and rhythm, no murmurs  / rubs / gallops. No extremity edema.  Abdomen: no tenderness, no masses palpated.  Bowel sounds positive.  Musculoskeletal: no clubbing / cyanosis. No joint deformity upper and lower extremities. Good ROM, no contractures. Normal muscle tone.  Skin: no rashes, lesions, ulcers. No induration Neurologic: No obvious facial asymmetry.  CN 2-12 grossly intact.  Able to close both eyes.  Sensation intact,  Strength 5/5 in all 4.  Persistent fasciculation of the left upper lip. Psychiatric: Normal judgment and insight. Alert and oriented x 3. Normal mood.     Labs on Admission: I have personally reviewed following labs and imaging studies  CBC: Recent Labs  Lab 05/30/20 2232  WBC 6.2  NEUTROABS 3.5  HGB 14.6  HCT 42.6  MCV 87.5  PLT 413*   Basic Metabolic Panel: Recent Labs  Lab 05/30/20 2232  NA 140  K 3.9  CL 108  CO2 23  GLUCOSE 97  BUN 11  CREATININE 0.96  CALCIUM 9.7   GFR: Estimated Creatinine Clearance: 172 mL/min (by C-G formula based on SCr of 0.96 mg/dL). Liver Function Tests: No results for input(s): AST, ALT, ALKPHOS, BILITOT, PROT, ALBUMIN in the last 168 hours. No results for input(s): LIPASE, AMYLASE in the last 168 hours. No results for input(s): AMMONIA in the last 168 hours. Coagulation Profile: No results for input(s): INR, PROTIME in the last 168 hours. Cardiac Enzymes: No results for input(s): CKTOTAL, CKMB, CKMBINDEX, TROPONINI in the last 168 hours. BNP (last 3 results) No results for input(s): PROBNP in the last 8760 hours. HbA1C: No results for input(s): HGBA1C in the last 72 hours. CBG: No results for input(s): GLUCAP in the last 168 hours. Lipid Profile: No results for input(s): CHOL, HDL, LDLCALC, TRIG, CHOLHDL, LDLDIRECT in the last 72 hours. Thyroid Function Tests: No results for input(s): TSH, T4TOTAL, FREET4, T3FREE, THYROIDAB in the last 72 hours. Anemia Panel: No results for input(s): VITAMINB12, FOLATE, FERRITIN, TIBC, IRON,  RETICCTPCT in the last 72 hours. Urine analysis: No results found for: COLORURINE, APPEARANCEUR, LABSPEC, PHURINE, GLUCOSEU, HGBUR, BILIRUBINUR, KETONESUR, PROTEINUR, UROBILINOGEN, NITRITE, LEUKOCYTESUR  Radiological Exams on Admission: MR BRAIN WO CONTRAST  Result Date: 05/30/2020 CLINICAL DATA:  Left facial weakness EXAM: MRI HEAD WITHOUT CONTRAST TECHNIQUE: Multiplanar, multiecho pulse sequences of the brain and surrounding structures were obtained without intravenous contrast. COMPARISON:  None. FINDINGS: Brain: No acute infarct, mass effect or extra-axial collection. No acute or chronic hemorrhage. There is multifocal hyperintense T2-weighted signal within the supratentorial white matter and within the left middle cerebellar peduncle. The midline structures are normal. Vascular: Major flow voids are preserved. Skull and upper cervical spine: Normal calvarium and skull base. Visualized upper cervical spine and soft tissues are normal. Sinuses/Orbits:No paranasal sinus fluid levels or advanced mucosal thickening. No mastoid or middle ear effusion. Normal orbits. IMPRESSION: 1. No acute intracranial abnormality. 2. Multifocal hyperintense T2-weighted signal within the supratentorial white matter and left middle cerebellar peduncle, which may be due to changes of early chronic small vessel ischemia. Demyelination is less likely as  the distribution is not typical. If there is clinical suspicion for demyelinating disease, postcontrast imaging might be helpful. Electronically Signed   By: Deatra Robinson M.D.   On: 05/30/2020 23:16      Assessment/Plan  Fasciculation of the left upper lip/beneath left eye w/abnormal MRI finding  -Unclear etiology, ? Bells palsy  -questionable chronic small ischemic changes versus demyelination seen on MRI wo contrast.  -Will obtain MRI w/contrast to further access -CBC and BMP unremarkable  -obtain TSH, Mg level, CK -check UDS -Consider neurology consult pending MRI  result  Morbid Obesity Family is working on improving diet  DVT prophylaxis:.Lovenox Code Status: Full Family Communication: Plan discussed with patient and mom at bedside  disposition Plan: Home with observation Consults called:  Admission status: Observation  Level of care: Med-Surg  Status is: Observation  The patient remains OBS appropriate and will d/c before 2 midnights.  Dispo: The patient is from: Home              Anticipated d/c is to: Home              Patient currently is not medically stable to d/c.   Difficult to place patient No         Anselm Jungling DO [facility]   If 7PM-7AM, please contact night-coverage [email or web address]   05/31/2020, 12:47 AM

## 2020-05-31 NOTE — ED Notes (Signed)
Per MRI tech, patient had one episode of vomiting noted after imaging.

## 2020-05-31 NOTE — Consult Note (Signed)
Neurology Consultation  Reason for Consult: left facial droop, twitching under left eyelid and face, concern for demyelinating disease Referring Physician: Dr Enedina Finner  CC: Left facial droop, left facial twitching  History is obtained from: patient and chart  HPI: Nathaniel Fowler is a 20 y.o. male no significant past medical history presents for concerns for left lower facial weakness and facial twitching.  He says the symptoms started to happen sometime on Sunday and did not resolve, making him come to the emergency room.  He had come in a couple months ago with complaints of numbness around his mouth and more on left face, CT head was done and discharged home without any definitive diagnosis due to normal imaging and lack of objective findings.  Yesterday, when he arrived to the emergency room, he had objective left lower facial weakness as well as some twitching Movements of his left face.  An MRI of the brain was done that showed T2/flair hyperintensities concerning for demyelination.  This was followed by MRI brain with contrast which showed multiple enhancing lesions-see details below. Denies any family history of MS, lupus, sarcoid or autoimmune diseases. Denies any prior episodes of loss of vision.  Denies any tingling numbness or weakness elsewhere in the body.  Denies of any episodes of difficulty with his gait.  Denies worsening of symptoms with heat and denies any electrical sensation down the spine while bending his neck.    ROS: Full ROS was performed and is negative except as noted in the HPI.    Past Medical History:  Diagnosis Date  . Asthma   . Prediabetes     History reviewed. No pertinent family history. No family history of autoimmune diseases including lupus, sarcoid, rheumatoid arthritis or multiple sclerosis.  Social History:   reports that he has never smoked. He has never used smokeless tobacco. He reports that he does not drink alcohol and does not use  drugs.  Currently in college, studying computer sciences  Medications  Current Facility-Administered Medications:  .  [START ON 06/01/2020] enoxaparin (LOVENOX) injection 67.5 mg, 0.5 mg/kg, Subcutaneous, Q24H, Tu, Ching T, DO .  methylPREDNISolone sodium succinate (SOLU-MEDROL) 1,000 mg in sodium chloride 0.9 % 50 mL IVPB, 1,000 mg, Intravenous, Q24H **AND** pantoprazole (PROTONIX) EC tablet 40 mg, 40 mg, Oral, Daily, Milon Dikes, MD  Exam: Current vital signs: BP 132/88 (BP Location: Right Arm)   Pulse 79   Temp 98 F (36.7 C) (Oral)   Resp 17   Ht 5\' 8"  (1.727 m)   Wt (!) 136.7 kg   SpO2 99%   BMI 45.82 kg/m  Vital signs in last 24 hours: Temp:  [98 F (36.7 C)-99.4 F (37.4 C)] 98 F (36.7 C) (04/22 0759) Pulse Rate:  [56-95] 79 (04/22 0759) Resp:  [14-18] 17 (04/22 0759) BP: (110-147)/(62-88) 132/88 (04/22 0759) SpO2:  [97 %-100 %] 99 % (04/22 0759) Weight:  [136.7 kg-145.2 kg] 136.7 kg (04/22 0212)  General: Awake alert in no distress HEENT: Normocephalic atraumatic Lungs: Clear Cardiovascular: Regular rate rhythm Abdomen soft nondistended nontender Extremities warm well perfused with no edema Neurological exam Awake alert oriented x3 No dysarthria Aphasia Cranial nerve examination: Pupils equal round reactive light, extract movements intact, visual fields appear full, face sensation is intact bilaterally, facial symmetry is distorted-at rest, he has left lower facial weakness.  He is able to shut his eyes tight bilaterally and able to wrinkle his forehead as well.  Auditory acuity intact, tongue and palate midline. Motor  exam: 5/5 with no drift in any of the 4 extremities.  Normal tone and normal range of motion Sensation intact to touch all over Coordination examination with no dysmetria in the upper extremities DTRs are brisk all over with subtle upgoing toe on the right, downgoing toe on the left. Gait was not tested but reported to be normal.  Labs I have  reviewed labs in epic and the results pertinent to this consultation are: CBC    Component Value Date/Time   WBC 5.4 05/31/2020 0433   RBC 4.71 05/31/2020 0433   HGB 13.9 05/31/2020 0433   HGB 14.0 02/24/2014 1020   HCT 41.5 05/31/2020 0433   HCT 43.1 02/24/2014 1020   PLT 406 (H) 05/31/2020 0433   PLT 347 02/24/2014 1020   MCV 88.1 05/31/2020 0433   MCV 87 02/24/2014 1020   MCH 29.5 05/31/2020 0433   MCHC 33.5 05/31/2020 0433   RDW 13.8 05/31/2020 0433   RDW 15.1 (H) 02/24/2014 1020   LYMPHSABS 2.1 05/30/2020 2232   LYMPHSABS 2.1 02/24/2014 1020   MONOABS 0.6 05/30/2020 2232   MONOABS 0.5 02/24/2014 1020   EOSABS 0.0 05/30/2020 2232   EOSABS 0.1 02/24/2014 1020   BASOSABS 0.0 05/30/2020 2232   BASOSABS 0.0 02/24/2014 1020    CMP     Component Value Date/Time   NA 141 05/31/2020 0433   NA 142 (H) 02/24/2014 1020   K 3.3 (L) 05/31/2020 0433   K 4.0 02/24/2014 1020   CL 106 05/31/2020 0433   CL 108 (H) 02/24/2014 1020   CO2 25 05/31/2020 0433   CO2 25 02/24/2014 1020   GLUCOSE 132 (H) 05/31/2020 0433   GLUCOSE 82 02/24/2014 1020   BUN 10 05/31/2020 0433   BUN 12 02/24/2014 1020   CREATININE 0.97 05/31/2020 0433   CREATININE 0.81 02/24/2014 1020   CALCIUM 9.5 05/31/2020 0433   CALCIUM 8.8 (L) 02/24/2014 1020   PROT 8.2 (H) 03/17/2020 2120   PROT 7.8 02/24/2014 1020   ALBUMIN 3.9 03/17/2020 2120   ALBUMIN 3.7 (L) 02/24/2014 1020   AST 19 03/17/2020 2120   AST 29 02/24/2014 1020   ALT 30 03/17/2020 2120   ALT 42 02/24/2014 1020   ALKPHOS 102 03/17/2020 2120   ALKPHOS 251 (H) 02/24/2014 1020   BILITOT 0.5 03/17/2020 2120   BILITOT 0.3 02/24/2014 1020   GFRNONAA >60 05/31/2020 0433   GFRAA NOT CALCULATED 10/14/2015 0945   Imaging I have reviewed the images obtained: MRI brain with and without contrast-done as 2 separate studies- multifocal hyperintense T2 weighted signal within the supratentorial white matter and left middle cerebellar peduncle.  Postcontrast  imaging with multiple punctate contrast-enhancing foci within the supratentorial white matter and brainstem.  Assessment:  20 year old with few days worth of left-sided facial weakness and twitching noted to have multiple T2/flair hyperintense signals on brain MRI with the postcontrast imaging showing enhancement consistent in a pattern with demyelinating disease such as MS. Other differentials include neuromyelitis optica spectrum disorders (NMO-SD) , anti MOG antibody associated demyelination. Needs acute high-dose IV steroid treatment  Impression: Multiple sclerosis-new diagnosis with exacerbation Other differentials include NMO-SD, anti MOG antibody associated demyelination  Recommendations: 5 days of IV Solu-Medrol 1 g daily. Protonix while on steroids MRI of the C-spine with and without contrast, MRI of the thoracic spine with and without contrast. Neuromyelitis optica antibody and anti-MOG antibody Requires outpatient follow-up with neurology for disease modifying treatment. Discussed imaging findings, showed the scans to the patient, mother  and grandmother at bedside. Discussed the current plan and outpatient plan with the patient and his family. Plan relayed to the hospitalist via secure chat -- Milon Dikes, MD Neurologist [facility] Pager: [phone]

## 2020-06-01 ENCOUNTER — Inpatient Hospital Stay: Payer: Managed Care, Other (non HMO)

## 2020-06-01 MED ORDER — SODIUM CHLORIDE 0.9 % IV SOLN
12.5000 mg | Freq: Once | INTRAVENOUS | Status: AC
  Administered 2020-06-02: 01:00:00 12.5 mg via INTRAVENOUS
  Filled 2020-06-01: qty 0.5

## 2020-06-01 MED ORDER — GADOBUTROL 1 MMOL/ML IV SOLN
10.0000 mL | Freq: Once | INTRAVENOUS | Status: AC | PRN
  Administered 2020-06-01: 10 mL via INTRAVENOUS

## 2020-06-01 NOTE — Progress Notes (Signed)
[facility]  - [place] at [facility]   PATIENT NAME: Nathaniel Fowler    MR#:  [id number]  DATE OF BIRTH:  2000-03-01  SUBJECTIVE:  patient came in with numbness on the left face with left eye twitching and facial droop. Feels bit better. Patient's mother and aunt at bedside. Denies any focal weakness in upper lower extremity  REVIEW OF SYSTEMS:   Review of Systems  Constitutional: Negative for chills, fever and weight loss.  HENT: Negative for ear discharge, ear pain and nosebleeds.   Eyes: Negative for blurred vision, pain and discharge.  Respiratory: Negative for sputum production, shortness of breath, wheezing and stridor.   Cardiovascular: Negative for chest pain, palpitations, orthopnea and PND.  Gastrointestinal: Negative for abdominal pain, diarrhea, nausea and vomiting.  Genitourinary: Negative for frequency and urgency.  Musculoskeletal: Negative for back pain and joint pain.  Neurological: Positive for weakness. Negative for sensory change, speech change and focal weakness.  Psychiatric/Behavioral: Negative for depression and hallucinations. The patient is not nervous/anxious.    Tolerating Diet:yes Tolerating PT: not needed  DRUG ALLERGIES:  No Known Allergies  VITALS:  Blood pressure (!) 142/75, pulse 75, temperature 97.9 F (36.6 C), temperature source Oral, resp. rate 20, height 5\' 8"  (1.727 m), weight (!) 136.7 kg, SpO2 98 %.  PHYSICAL EXAMINATION:   Physical Exam  GENERAL:  20 y.o.-year-old patient lying in the bed with no acute distress.  Obese HEENT: Head atraumatic, normocephalic. Oropharynx and nasopharynx clear.  LUNGS: Normal breath sounds bilaterally, no wheezing, rales, rhonchi. No use of accessory muscles of respiration.  CARDIOVASCULAR: S1, S2 normal. No murmurs, rubs, or gallops.  ABDOMEN: Soft, nontender, nondistended. Bowel sounds present. No organomegaly or mass.  EXTREMITIES: No cyanosis, clubbing or edema b/l.     NEUROLOGIC: Cranial nerves II through XII are intact. No focal Motor or sensory deficits b/l.  Left eye small with left facial drool PSYCHIATRIC:  patient is alert and oriented x 3.  SKIN: No obvious rash, lesion, or ulcer.   LABORATORY PANEL:  CBC Recent Labs  Lab 05/31/20 0433  WBC 5.4  HGB 13.9  HCT 41.5  PLT 406*    Chemistries  Recent Labs  Lab 05/30/20 2232 05/31/20 0433  NA 140 141  K 3.9 3.3*  CL 108 106  CO2 23 25  GLUCOSE 97 132*  BUN 11 10  CREATININE 0.96 0.97  CALCIUM 9.7 9.5  MG 2.0  --    Cardiac Enzymes No results for input(s): TROPONINI in the last 168 hours. RADIOLOGY:  MR BRAIN WO CONTRAST  Result Date: 05/30/2020 CLINICAL DATA:  Left facial weakness EXAM: MRI HEAD WITHOUT CONTRAST TECHNIQUE: Multiplanar, multiecho pulse sequences of the brain and surrounding structures were obtained without intravenous contrast. COMPARISON:  None. FINDINGS: Brain: No acute infarct, mass effect or extra-axial collection. No acute or chronic hemorrhage. There is multifocal hyperintense T2-weighted signal within the supratentorial white matter and within the left middle cerebellar peduncle. The midline structures are normal. Vascular: Major flow voids are preserved. Skull and upper cervical spine: Normal calvarium and skull base. Visualized upper cervical spine and soft tissues are normal. Sinuses/Orbits:No paranasal sinus fluid levels or advanced mucosal thickening. No mastoid or middle ear effusion. Normal orbits. IMPRESSION: 1. No acute intracranial abnormality. 2. Multifocal hyperintense T2-weighted signal within the supratentorial white matter and left middle cerebellar peduncle, which may be due to changes of early chronic small vessel ischemia. Demyelination is less likely as the distribution is not typical. If there  is clinical suspicion for demyelinating disease, postcontrast imaging might be helpful. Electronically Signed   By: Deatra Robinson M.D.   On: 05/30/2020 23:16    MR BRAIN W CONTRAST  Result Date: 05/31/2020 CLINICAL DATA:  Left facial numbness and twitching EXAM: MRI HEAD WITH CONTRAST TECHNIQUE: Multiplanar, multiecho pulse sequences of the brain and surrounding structures were obtained with intravenous contrast. CONTRAST:  71mL GADAVIST GADOBUTROL 1 MMOL/ML IV SOLN COMPARISON:  Brain MRI without contrast 05/30/2020 FINDINGS: There are 4-5 punctate contrast-enhancing foci within the supratentorial white matter. No enhancing lesion in the cerebellum. There is a punctate focus of enhancement in the ventral left medulla oblongata. IMPRESSION: Multiple punctate contrast-enhancing foci within the supratentorial white matter and brainstem. Acute demyelination is the primary consideration. Possibilities include multiple sclerosis and neuromyelitis spectrum disorders Electronically Signed   By: Deatra Robinson M.D.   On: 05/31/2020 01:34   MR CERVICAL SPINE W WO CONTRAST  Result Date: 06/01/2020 CLINICAL DATA:  Left facial twitching. Possible demyelinating disease. Abnormal brain MRI. EXAM: MRI CERVICAL AND THORACIC SPINE WITHOUT AND WITH CONTRAST TECHNIQUE: Multiplanar and multiecho pulse sequences of the cervical spine, to include the craniocervical junction and cervicothoracic junction, and the thoracic spine, were obtained without and with intravenous contrast. CONTRAST:  38mL GADAVIST GADOBUTROL 1 MMOL/ML IV SOLN COMPARISON:  None. FINDINGS: MRI CERVICAL SPINE FINDINGS Alignment: Physiologic. Vertebrae: No fracture, evidence of discitis, or bone lesion. Cord: Normal signal and morphology. Posterior Fossa, vertebral arteries, paraspinal tissues: Negative. Disc levels: No spinal canal or neural foraminal stenosis.  No disc herniation. MRI THORACIC SPINE FINDINGS Alignment:  Physiologic. Vertebrae: No fracture, evidence of discitis, or bone lesion. Cord:  Normal signal and morphology. Paraspinal and other soft tissues: Negative. Disc levels: No spinal canal or neural  foraminal stenosis. IMPRESSION: No evidence of demyelinating disease in the cervical or thoracic spine. Electronically Signed   By: Deatra Robinson M.D.   On: 06/01/2020 03:22   MR THORACIC SPINE W WO CONTRAST  Result Date: 06/01/2020 CLINICAL DATA:  Left facial twitching. Possible demyelinating disease. Abnormal brain MRI. EXAM: MRI CERVICAL AND THORACIC SPINE WITHOUT AND WITH CONTRAST TECHNIQUE: Multiplanar and multiecho pulse sequences of the cervical spine, to include the craniocervical junction and cervicothoracic junction, and the thoracic spine, were obtained without and with intravenous contrast. CONTRAST:  3mL GADAVIST GADOBUTROL 1 MMOL/ML IV SOLN COMPARISON:  None. FINDINGS: MRI CERVICAL SPINE FINDINGS Alignment: Physiologic. Vertebrae: No fracture, evidence of discitis, or bone lesion. Cord: Normal signal and morphology. Posterior Fossa, vertebral arteries, paraspinal tissues: Negative. Disc levels: No spinal canal or neural foraminal stenosis.  No disc herniation. MRI THORACIC SPINE FINDINGS Alignment:  Physiologic. Vertebrae: No fracture, evidence of discitis, or bone lesion. Cord:  Normal signal and morphology. Paraspinal and other soft tissues: Negative. Disc levels: No spinal canal or neural foraminal stenosis. IMPRESSION: No evidence of demyelinating disease in the cervical or thoracic spine. Electronically Signed   By: Deatra Robinson M.D.   On: 06/01/2020 03:22   ASSESSMENT AND PLAN:  Nathaniel Fowler is a 20 y.o. male with no significant past medical history who presents with concerns of persistent twitching beneath his left eye and left lip. For the past 4 days, he has noticed persistent twitching beneath his left eye and left lip and has noted some left facial drooping.    Fasciculation of the left upper lip/beneath left eye w/abnormal MRI finding s/o Demyelination suspect Multiple sclerosis--new dx -- MRI w/contrast multifocal hyperintense T2 weighted signal within the  supratentorial white matter and left middle cerebellar peduncle.  Postcontrast imaging with multiple punctate contrast-enhancing foci within the supratentorial white matter and brainstem. --Neurology consult with Dr Maurene Capes. Patient started on high dose of IV Solu-Medrol. He will need five doses. -- MRI cervical and lumbar spine negative  -- pt will follow-up with Dr. Cristopher Peru neurology as outpatient on May 16  Morbid Obesity Lifestyle, diet and exercise discussed  DVT prophylaxis:.Lovenox Code Status: Full Family Communication: Plan discussed with patient and mom at bedside  disposition Plan: Home  Consults called:   Level of care: Med-Surg Status is: Inpatient  Remains inpatient appropriate because:IV treatments appropriate due to intensity of illness or inability to take PO and Inpatient level of care appropriate due to severity of illness   Dispo: The patient is from: Home              Anticipated d/c is to: Home              Patient currently is not medically stable to d/c.   Difficult to place patient No   Patient is diagnosed with multiple sclerosis. Will stay in the hospital for 5 doses of IV steroid.     TOTAL TIME TAKING CARE OF THIS PATIENT: 25 minutes.  >50% time spent on counselling and coordination of care  Note: This dictation was prepared with Dragon dictation along with smaller phrase technology. Any transcriptional errors that result from this process are unintentional.  Enedina Finner M.D    [facility]   CC: Primary care physician; Renaee Munda, MD Patient ID: Burnice Logan, male   DOB: 2001-01-21, 19 y.o.   MRN: [id number]

## 2020-06-01 NOTE — Progress Notes (Addendum)
Neurology Progress Note   S:// Seen and examined No acute complaints Tolerated first infusion of IVMP ok Mild insomnia. No GI complaints   O:// Current vital signs: BP (!) 142/75 (BP Location: Right Arm)   Pulse 75   Temp 97.9 F (36.6 C) (Oral)   Resp 20   Ht 5\' 8"  (1.727 m)   Wt (!) 136.7 kg   SpO2 98%   BMI 45.82 kg/m  Vital signs in last 24 hours: Temp:  [97.7 F (36.5 C)-98.9 F (37.2 C)] 97.9 F (36.6 C) (04/23 0800) Pulse Rate:  [75-108] 75 (04/23 0800) Resp:  [16-20] 20 (04/23 0800) BP: (120-153)/(70-87) 142/75 (04/23 0800) SpO2:  [98 %-100 %] 98 % (04/23 0800) Unchanged exam General: Awake alert in no distress HEENT: Normocephalic atraumatic Lungs: Clear Cardiovascular: Regular rate rhythm Abdomen soft nondistended nontender Extremities warm well perfused with no edema Neurological exam Awake alert oriented x3 No dysarthria Aphasia Cranial nerve examination: Pupils equal round reactive light, extract movements intact, visual fields appear full, face sensation is intact bilaterally, facial symmetry is distorted-at rest, he has left lower facial weakness.  He is able to shut his eyes tight bilaterally and able to wrinkle his forehead as well.  Auditory acuity intact, tongue and palate midline. Motor exam: 5/5 with no drift in any of the 4 extremities.  Normal tone and normal range of motion Sensation intact to touch all over Coordination examination with no dysmetria in the upper extremities DTRs are brisk all over with subtle upgoing toe on the right, downgoing toe on the left. Gait was not tested but reported to be normal.  Medications  Current Facility-Administered Medications:  .  0.9 %  sodium chloride infusion, , Intravenous, PRN, 08-27-1980, MD, Stopped at 05/31/20 1245 .  enoxaparin (LOVENOX) injection 67.5 mg, 0.5 mg/kg, Subcutaneous, Q24H, Tu, Ching T, DO, 67.5 mg at 06/01/20 0738 .  methylPREDNISolone sodium succinate (SOLU-MEDROL) 1,000 mg in  sodium chloride 0.9 % 50 mL IVPB, 1,000 mg, Intravenous, Q18H, Last Rate: 58 mL/hr at 06/01/20 0615, 1,000 mg at 06/01/20 0615 **AND** pantoprazole (PROTONIX) EC tablet 40 mg, 40 mg, Oral, Daily, 06/03/20, MD, 40 mg at 06/01/20 0737 Labs CBC    Component Value Date/Time   WBC 5.4 05/31/2020 0433   RBC 4.71 05/31/2020 0433   HGB 13.9 05/31/2020 0433   HGB 14.0 02/24/2014 1020   HCT 41.5 05/31/2020 0433   HCT 43.1 02/24/2014 1020   PLT 406 (H) 05/31/2020 0433   PLT 347 02/24/2014 1020   MCV 88.1 05/31/2020 0433   MCV 87 02/24/2014 1020   MCH 29.5 05/31/2020 0433   MCHC 33.5 05/31/2020 0433   RDW 13.8 05/31/2020 0433   RDW 15.1 (H) 02/24/2014 1020   LYMPHSABS 2.1 05/30/2020 2232   LYMPHSABS 2.1 02/24/2014 1020   MONOABS 0.6 05/30/2020 2232   MONOABS 0.5 02/24/2014 1020   EOSABS 0.0 05/30/2020 2232   EOSABS 0.1 02/24/2014 1020   BASOSABS 0.0 05/30/2020 2232   BASOSABS 0.0 02/24/2014 1020    CMP     Component Value Date/Time   NA 141 05/31/2020 0433   NA 142 (H) 02/24/2014 1020   K 3.3 (L) 05/31/2020 0433   K 4.0 02/24/2014 1020   CL 106 05/31/2020 0433   CL 108 (H) 02/24/2014 1020   CO2 25 05/31/2020 0433   CO2 25 02/24/2014 1020   GLUCOSE 132 (H) 05/31/2020 0433   GLUCOSE 82 02/24/2014 1020   BUN 10 05/31/2020 0433  BUN 12 02/24/2014 1020   CREATININE 0.97 05/31/2020 0433   CREATININE 0.81 02/24/2014 1020   CALCIUM 9.5 05/31/2020 0433   CALCIUM 8.8 (L) 02/24/2014 1020   PROT 8.2 (H) 03/17/2020 2120   PROT 7.8 02/24/2014 1020   ALBUMIN 3.9 03/17/2020 2120   ALBUMIN 3.7 (L) 02/24/2014 1020   AST 19 03/17/2020 2120   AST 29 02/24/2014 1020   ALT 30 03/17/2020 2120   ALT 42 02/24/2014 1020   ALKPHOS 102 03/17/2020 2120   ALKPHOS 251 (H) 02/24/2014 1020   BILITOT 0.5 03/17/2020 2120   BILITOT 0.3 02/24/2014 1020   GFRNONAA >60 05/31/2020 0433   GFRAA NOT CALCULATED 10/14/2015 0945    Imaging I have reviewed images in epic and the results pertinent to  this consultation are: MRI brain with and without contrast-done as 2 separate studies- multifocal hyperintense T2 weighted signal within the supratentorial white matter and left middle cerebellar peduncle.  Postcontrast imaging with multiple punctate contrast-enhancing foci within the supratentorial white matter and brainstem. MRI C and T-spine - no demyelinating lesions. No abnormal enhancement.  Assessment:  20 year old with few days worth of left-sided facial weakness and twitching noted to have multiple T2/flair hyperintense signals on brain MRI with the postcontrast imaging showing enhancement consistent in a pattern with demyelinating disease such as MS.  Other differentials include neuromyelitis optica spectrum disorders (NMO-SD) , anti MOG antibody associated demyelination.  Initiated high-dose IV steroid treatment  Impression: Multiple sclerosis-new diagnosis with exacerbation Other differentials include NMO-SD, anti MOG antibody associated demyelination  Recommendations: 5 days of IV Solu-Medrol 1 g daily. Today is D2/5 Protonix while on steroids Neuromyelitis optica antibody and anti-MOG antibody-send out ordered - pending Also ordering ACE level serum. Requires outpatient follow-up with neurology for disease modifying treatment. Discussed imaging findings, showed the scans to the patient, mother and another family member at bedside. Discussed the current plan and outpatient plan with the patient and his family. Plan relayed to the hospitalist via secure chat   -- Milon Dikes, MD Neurologist [facility] Pager: [phone]

## 2020-06-02 MED ORDER — LORAZEPAM 1 MG PO TABS
1.0000 mg | ORAL_TABLET | Freq: Once | ORAL | Status: AC
  Administered 2020-06-02: 1 mg via ORAL
  Filled 2020-06-02: qty 1

## 2020-06-02 NOTE — Progress Notes (Signed)
[facility]  - [place] at [facility]   PATIENT NAME: Nathaniel Fowler    MR#:  [id number]  DATE OF BIRTH:  07/24/2000  SUBJECTIVE:  patient came in with numbness on the left face with left eye twitching and facial droop. Feels bit better.  Denies any focal weakness in upper lower extremity  REVIEW OF SYSTEMS:   Review of Systems  Constitutional: Negative for chills, fever and weight loss.  HENT: Negative for ear discharge, ear pain and nosebleeds.   Eyes: Negative for blurred vision, pain and discharge.  Respiratory: Negative for sputum production, shortness of breath, wheezing and stridor.   Cardiovascular: Negative for chest pain, palpitations, orthopnea and PND.  Gastrointestinal: Negative for abdominal pain, diarrhea, nausea and vomiting.  Genitourinary: Negative for frequency and urgency.  Musculoskeletal: Negative for back pain and joint pain.  Neurological: Positive for weakness. Negative for sensory change, speech change and focal weakness.  Psychiatric/Behavioral: Negative for depression and hallucinations. The patient is not nervous/anxious.    Tolerating Diet:yes Tolerating PT: not needed  DRUG ALLERGIES:  No Known Allergies  VITALS:  Blood pressure 132/68, pulse (!) 57, temperature 98.8 F (37.1 C), temperature source Oral, resp. rate (!) 22, height 5\' 8"  (1.727 m), weight (!) 136.7 kg, SpO2 98 %.  PHYSICAL EXAMINATION:   Physical Exam  GENERAL:  20 y.o.-year-old patient lying in the bed with no acute distress.  Obese HEENT: Head atraumatic, normocephalic. Oropharynx and nasopharynx clear. Left facial mild drool LUNGS: Normal breath sounds bilaterally, no wheezing, rales, rhonchi. No use of accessory muscles of respiration.  CARDIOVASCULAR: S1, S2 normal. No murmurs, rubs, or gallops.  ABDOMEN: Soft, nontender, nondistended. Bowel sounds present. No organomegaly or mass.  EXTREMITIES: No cyanosis, clubbing or edema b/l.    NEUROLOGIC: Cranial  nerves II through XII are intact. No focal Motor or sensory deficits b/l.  Left eye small with left facial drool PSYCHIATRIC:  patient is alert and oriented x 3.  SKIN: No obvious rash, lesion, or ulcer.   LABORATORY PANEL:  CBC Recent Labs  Lab 05/31/20 0433  WBC 5.4  HGB 13.9  HCT 41.5  PLT 406*    Chemistries  Recent Labs  Lab 05/30/20 2232 05/31/20 0433  NA 140 141  K 3.9 3.3*  CL 108 106  CO2 23 25  GLUCOSE 97 132*  BUN 11 10  CREATININE 0.96 0.97  CALCIUM 9.7 9.5  MG 2.0  --    Cardiac Enzymes No results for input(s): TROPONINI in the last 168 hours. RADIOLOGY:  MR CERVICAL SPINE W WO CONTRAST  Result Date: 06/01/2020 CLINICAL DATA:  Left facial twitching. Possible demyelinating disease. Abnormal brain MRI. EXAM: MRI CERVICAL AND THORACIC SPINE WITHOUT AND WITH CONTRAST TECHNIQUE: Multiplanar and multiecho pulse sequences of the cervical spine, to include the craniocervical junction and cervicothoracic junction, and the thoracic spine, were obtained without and with intravenous contrast. CONTRAST:  4mL GADAVIST GADOBUTROL 1 MMOL/ML IV SOLN COMPARISON:  None. FINDINGS: MRI CERVICAL SPINE FINDINGS Alignment: Physiologic. Vertebrae: No fracture, evidence of discitis, or bone lesion. Cord: Normal signal and morphology. Posterior Fossa, vertebral arteries, paraspinal tissues: Negative. Disc levels: No spinal canal or neural foraminal stenosis.  No disc herniation. MRI THORACIC SPINE FINDINGS Alignment:  Physiologic. Vertebrae: No fracture, evidence of discitis, or bone lesion. Cord:  Normal signal and morphology. Paraspinal and other soft tissues: Negative. Disc levels: No spinal canal or neural foraminal stenosis. IMPRESSION: No evidence of demyelinating disease in the cervical or thoracic spine. Electronically  Signed   By: Deatra Robinson M.D.   On: 06/01/2020 03:22   MR THORACIC SPINE W WO CONTRAST  Result Date: 06/01/2020 CLINICAL DATA:  Left facial twitching. Possible  demyelinating disease. Abnormal brain MRI. EXAM: MRI CERVICAL AND THORACIC SPINE WITHOUT AND WITH CONTRAST TECHNIQUE: Multiplanar and multiecho pulse sequences of the cervical spine, to include the craniocervical junction and cervicothoracic junction, and the thoracic spine, were obtained without and with intravenous contrast. CONTRAST:  69mL GADAVIST GADOBUTROL 1 MMOL/ML IV SOLN COMPARISON:  None. FINDINGS: MRI CERVICAL SPINE FINDINGS Alignment: Physiologic. Vertebrae: No fracture, evidence of discitis, or bone lesion. Cord: Normal signal and morphology. Posterior Fossa, vertebral arteries, paraspinal tissues: Negative. Disc levels: No spinal canal or neural foraminal stenosis.  No disc herniation. MRI THORACIC SPINE FINDINGS Alignment:  Physiologic. Vertebrae: No fracture, evidence of discitis, or bone lesion. Cord:  Normal signal and morphology. Paraspinal and other soft tissues: Negative. Disc levels: No spinal canal or neural foraminal stenosis. IMPRESSION: No evidence of demyelinating disease in the cervical or thoracic spine. Electronically Signed   By: Deatra Robinson M.D.   On: 06/01/2020 03:22   ASSESSMENT AND PLAN:  Nathaniel Fowler is a 20 y.o. male with no significant past medical history who presents with concerns of persistent twitching beneath his left eye and left lip. For the past 4 days, he has noticed persistent twitching beneath his left eye and left lip and has noted some left facial drooping.    Fasciculation of the left upper lip/beneath left eye w/abnormal MRI finding s/o Demyelination suspect Multiple sclerosis--new dx -- MRI w/contrast multifocal hyperintense T2 weighted signal within the supratentorial white matter and left middle cerebellar peduncle.  Postcontrast imaging with multiple punctate contrast-enhancing foci within the supratentorial white matter and brainstem. --Neurology consult with Dr Maurene Capes. Patient started on high dose of IV Solu-Medrol. He will  need five doses. -- MRI cervical and lumbar spine negative  -- pt will follow-up with Dr. Cristopher Peru neurology as outpatient on May 16  Morbid Obesity Lifestyle, diet and exercise discussed  DVT prophylaxis:.Lovenox Code Status: Full Family Communication: Plan discussed with patient and mom at bedside  disposition Plan: Home  Consults called:   Level of care: Med-Surg Status is: Inpatient  Remains inpatient appropriate because:IV treatments appropriate due to intensity of illness or inability to take PO and Inpatient level of care appropriate due to severity of illness   Dispo: The patient is from: Home              Anticipated d/c is to: Home              Patient currently is not medically stable to d/c.   Difficult to place patient No   Patient is diagnosed with multiple sclerosis. Will stay in the hospital for 5 doses of IV steroid.     TOTAL TIME TAKING CARE OF THIS PATIENT: 25 minutes.  >50% time spent on counselling and coordination of care  Note: This dictation was prepared with Dragon dictation along with smaller phrase technology. Any transcriptional errors that result from this process are unintentional.  Enedina Finner M.D    [facility]   CC: Primary care physician; Renaee Munda, MD Patient ID: Burnice Logan, male   DOB: 03-01-2000, 19 y.o.   MRN: [id number]

## 2020-06-02 NOTE — Progress Notes (Signed)
Neurology Progress Note   S:// Seen and examined No acute complaints Doing well with IV steroids. No side effects. No GI complaints  O:// Current vital signs: BP (!) 118/59 (BP Location: Right Arm)   Pulse 75   Temp 98.1 F (36.7 C) (Oral)   Resp 16   Ht 5\' 8"  (1.727 m)   Wt (!) 136.7 kg   SpO2 99%   BMI 45.82 kg/m  Vital signs in last 24 hours: Temp:  [98.1 F (36.7 C)-98.5 F (36.9 C)] 98.1 F (36.7 C) (04/24 0506) Pulse Rate:  [72-77] 75 (04/24 0506) Resp:  [16-18] 16 (04/24 0506) BP: (118-134)/(59-71) 118/59 (04/24 0506) SpO2:  [97 %-99 %] 99 % (04/24 0506) General: Awake alert in no distress HEENT: Normocephalic atraumatic Lungs: Clear Cardiovascular: Regular rate rhythm Abdomen soft nondistended nontender Extremities warm well perfused with no edema Neurological exam Awake alert oriented x3 No dysarthria Aphasia Cranial nerve examination: Pupils equal round reactive light, extract movements intact, visual fields appear full, face sensation is intact bilaterally, facial symmetry is distorted-at rest, he has left lower facial weakness.  He is able to shut his eyes tight bilaterally and able to wrinkle his forehead as well.  Auditory acuity intact, tongue and palate midline. Motor exam: 5/5 with no drift in any of the 4 extremities.  Normal tone and normal range of motion Sensation intact to touch all over Coordination examination with no dysmetria in the upper extremities DTRs are brisk all over with subtle upgoing toe on the right, downgoing toe on the left. Gait was not tested but reported to be normal.  Medications  Current Facility-Administered Medications:  .  0.9 %  sodium chloride infusion, , Intravenous, PRN, 12-30-2005, MD, Last Rate: 10 mL/hr at 06/02/20 0041, 50 mL at 06/02/20 0041 .  enoxaparin (LOVENOX) injection 67.5 mg, 0.5 mg/kg, Subcutaneous, Q24H, Tu, Ching T, DO, 67.5 mg at 06/01/20 0738 .  methylPREDNISolone sodium succinate (SOLU-MEDROL)  1,000 mg in sodium chloride 0.9 % 50 mL IVPB, 1,000 mg, Intravenous, Q18H, Last Rate: 58 mL/hr at 06/01/20 2156, 1,000 mg at 06/01/20 2156 **AND** pantoprazole (PROTONIX) EC tablet 40 mg, 40 mg, Oral, Daily, 2157, MD, 40 mg at 06/01/20 0737 Labs CBC    Component Value Date/Time   WBC 5.4 05/31/2020 0433   RBC 4.71 05/31/2020 0433   HGB 13.9 05/31/2020 0433   HGB 14.0 02/24/2014 1020   HCT 41.5 05/31/2020 0433   HCT 43.1 02/24/2014 1020   PLT 406 (H) 05/31/2020 0433   PLT 347 02/24/2014 1020   MCV 88.1 05/31/2020 0433   MCV 87 02/24/2014 1020   MCH 29.5 05/31/2020 0433   MCHC 33.5 05/31/2020 0433   RDW 13.8 05/31/2020 0433   RDW 15.1 (H) 02/24/2014 1020   LYMPHSABS 2.1 05/30/2020 2232   LYMPHSABS 2.1 02/24/2014 1020   MONOABS 0.6 05/30/2020 2232   MONOABS 0.5 02/24/2014 1020   EOSABS 0.0 05/30/2020 2232   EOSABS 0.1 02/24/2014 1020   BASOSABS 0.0 05/30/2020 2232   BASOSABS 0.0 02/24/2014 1020    CMP     Component Value Date/Time   NA 141 05/31/2020 0433   NA 142 (H) 02/24/2014 1020   K 3.3 (L) 05/31/2020 0433   K 4.0 02/24/2014 1020   CL 106 05/31/2020 0433   CL 108 (H) 02/24/2014 1020   CO2 25 05/31/2020 0433   CO2 25 02/24/2014 1020   GLUCOSE 132 (H) 05/31/2020 0433   GLUCOSE 82 02/24/2014 1020   BUN  10 05/31/2020 0433   BUN 12 02/24/2014 1020   CREATININE 0.97 05/31/2020 0433   CREATININE 0.81 02/24/2014 1020   CALCIUM 9.5 05/31/2020 0433   CALCIUM 8.8 (L) 02/24/2014 1020   PROT 8.2 (H) 03/17/2020 2120   PROT 7.8 02/24/2014 1020   ALBUMIN 3.9 03/17/2020 2120   ALBUMIN 3.7 (L) 02/24/2014 1020   AST 19 03/17/2020 2120   AST 29 02/24/2014 1020   ALT 30 03/17/2020 2120   ALT 42 02/24/2014 1020   ALKPHOS 102 03/17/2020 2120   ALKPHOS 251 (H) 02/24/2014 1020   BILITOT 0.5 03/17/2020 2120   BILITOT 0.3 02/24/2014 1020   GFRNONAA >60 05/31/2020 0433   GFRAA NOT CALCULATED 10/14/2015 0945    Imaging I have reviewed images in epic and the results  pertinent to this consultation are: MRI brain with and without contrast-done as 2 separate studies- multifocal hyperintense T2 weighted signal within the supratentorial white matter and left middle cerebellar peduncle.  Postcontrast imaging with multiple punctate contrast-enhancing foci within the supratentorial white matter and brainstem. MRI C and T-spine - no demyelinating lesions. No abnormal enhancement.  Assessment:  20 year old with few days worth of left-sided facial weakness and twitching noted to have multiple T2/flair hyperintense signals on brain MRI with the postcontrast imaging showing enhancement consistent in a pattern with demyelinating disease such as MS.  Other differentials include neuromyelitis optica spectrum disorders (NMO-SD) , anti MOG antibody associated demyelination.  Initiated high-dose IV steroid treatment  Impression: Multiple sclerosis-new diagnosis with exacerbation Other differentials include NMO-SD, anti MOG antibody associated demyelination  Recommendations:  5 days of IV Solu-Medrol 1 g daily-received a total of 3 doses with fourth dose this evening and fifth dose tomorrow - can be discharged after the 5th dose.  Protonix while on steroids  Neuromyelitis optica antibody and anti-MOG antibody-send out ordered - pending  Pending ACE level serum-can be followed outpatient.  Requires outpatient follow-up with neurology for disease modifying treatment. Has an appt with Dr. Sherryll Burger at [facility] in May. Keep that appt.  Plan relayed to the hospitalist via secure chat  Inpatient neurology will sign off and will be available as needed. Please call with questions. -- Milon Dikes, MD Neurologist [facility] Pager: [phone]

## 2020-06-03 LAB — MISC LABCORP TEST (SEND OUT): Labcorp test code: 505310

## 2020-06-03 LAB — ANGIOTENSIN CONVERTING ENZYME: Angiotensin-Converting Enzyme: 20 U/L (ref 14–82)

## 2020-06-03 LAB — NEUROMYELITIS OPTICA AUTOAB, IGG: NMO-IgG: <1.5 U/mL (ref 0.0–3.0)

## 2020-06-03 MED ORDER — CHLORPROMAZINE HCL 10 MG PO TABS
10.0000 mg | ORAL_TABLET | Freq: Once | ORAL | 0 refills | Status: AC | PRN
Start: 2020-06-03 — End: ?

## 2020-06-03 MED ORDER — CHLORPROMAZINE HCL 10 MG PO TABS
10.0000 mg | ORAL_TABLET | Freq: Once | ORAL | Status: DC | PRN
  Filled 2020-06-03: qty 1

## 2020-06-03 NOTE — Progress Notes (Incomplete)
IV removed before discharge. 

## 2020-06-03 NOTE — Discharge Summary (Signed)
[facility] - [place] [facility]   PATIENT NAME: Nathaniel Fowler    MR#:  [id number]  DATE OF BIRTH:  2000-08-09  DATE OF ADMISSION:  05/30/2020 ADMITTING PHYSICIAN: Anselm Jungling, DO  DATE OF DISCHARGE: 06/03/2020  PRIMARY CARE PHYSICIAN: Renaee Munda, MD    ADMISSION DIAGNOSIS:  Weakness [R53.1] Abnormal MRI [R93.89] Left facial numbness [R20.0]  DISCHARGE DIAGNOSIS:  Left facial weakness/drool suspected demyelinating disorder/Multiple sclerosis Obesity  SECONDARY DIAGNOSIS:   Past Medical History:  Diagnosis Date  . Asthma   . Prediabetes     HOSPITAL COURSE:  Nathaniel Jamal Packinghamis a 20 y.o.malewithno significant past medical history who presents with concerns of persistent twitching beneath his left eye and left lip. For the past4days, he has noticed persistent twitching beneath his left eye and left lip and has noted some left facial drooping.   Fasciculation of the left upper lip/beneath left eye w/abnormal MRI findings/o Demyelination suspect Multiple sclerosis--new dx -- MRI w/contrast multifocal hyperintense T2 weighted signal within the supratentorial white matter and left middle cerebellar peduncle. Postcontrast imaging with multiple punctate contrast-enhancing foci within the supratentorial white matter and brainstem. --Neurology consult with Dr Maurene Capes. Patient started on high dose of IV Solu-Medrol. He will need five doses. -- MRI cervical and lumbar spine negative  -- pt will follow-up with Dr. Cristopher Peru neurology as outpatient on May 16--emailed dr shah  Morbid Obesity Lifestyle, diet and exercise discussed  Hiccups --prn thorazine  DVT prophylaxis:.Lovenox Code Status: Full Family Communication: Plan discussed with patientand momat bedside  disposition Plan: Home   D/c home today after IV steroid dose  CONSULTS OBTAINED:    DRUG ALLERGIES:  No Known Allergies  DISCHARGE MEDICATIONS:    Allergies as of 06/03/2020   No Known Allergies     Medication List    STOP taking these medications   cetirizine 10 MG tablet Commonly known as: ZYRTEC   ondansetron 4 MG disintegrating tablet Commonly known as: Zofran ODT     TAKE these medications   chlorproMAZINE 10 MG tablet Commonly known as: THORAZINE Take 1 tablet (10 mg total) by mouth once as needed for hiccoughs.   loratadine 10 MG tablet Commonly known as: CLARITIN Take 10 mg by mouth daily as needed for allergies.       If you experience worsening of your admission symptoms, develop shortness of breath, life threatening emergency, suicidal or homicidal thoughts you must seek medical attention immediately by calling 911 or calling your MD immediately  if symptoms less severe.  You Must read complete instructions/literature along with all the possible adverse reactions/side effects for all the Medicines you take and that have been prescribed to you. Take any new Medicines after you have completely understood and accept all the possible adverse reactions/side effects.   Please note  You were cared for by a hospitalist during your hospital stay. If you have any questions about your discharge medications or the care you received while you were in the hospital after you are discharged, you can call the unit and asked to speak with the hospitalist on call if the hospitalist that took care of you is not available. Once you are discharged, your primary care physician will handle any further medical issues. Please note that NO REFILLS for any discharge medications will be authorized once you are discharged, as it is imperative that you return to your primary care physician (or establish a relationship with a primary care physician if you do not  have one) for your aftercare needs so that they can reassess your need for medications and monitor your lab values. Today   SUBJECTIVE   Hiccups Tolerating po diet  VITAL SIGNS:   Blood pressure 140/76, pulse 74, temperature 97.7 F (36.5 C), temperature source Oral, resp. rate 18, height 5\' 8"  (1.727 m), weight (!) 136.7 kg, SpO2 97 %.  I/O:    Intake/Output Summary (Last 24 hours) at 06/03/2020 0841 Last data filed at 06/02/2020 1925 Gross per 24 hour  Intake 896 ml  Output --  Net 896 ml    PHYSICAL EXAMINATION:  GENERAL:  20 y.o.-year-old patient lying in the bed with no acute distress.  Obese HEENT: Head atraumatic, normocephalic. Oropharynx and nasopharynx clear. Left facial mild drool LUNGS: Normal breath sounds bilaterally, no wheezing, rales, rhonchi. No use of accessory muscles of respiration.  CARDIOVASCULAR: S1, S2 normal. No murmurs, rubs, or gallops.  ABDOMEN: Soft, nontender, nondistended. Bowel sounds present. No organomegaly or mass.  EXTREMITIES: No cyanosis, clubbing or edema b/l.    NEUROLOGIC:No focal Motor or sensory deficits b/l.  Left eye small with left facial drool PSYCHIATRIC:  patient is alert and oriented x 3.  SKIN: No obvious rash, lesion, or ulcer.   DATA REVIEW:   CBC  Recent Labs  Lab 05/31/20 0433  WBC 5.4  HGB 13.9  HCT 41.5  PLT 406*    Chemistries  Recent Labs  Lab 05/30/20 2232 05/31/20 0433  NA 140 141  K 3.9 3.3*  CL 108 106  CO2 23 25  GLUCOSE 97 132*  BUN 11 10  CREATININE 0.96 0.97  CALCIUM 9.7 9.5  MG 2.0  --     Microbiology Results   Recent Results (from the past 240 hour(s))  SARS CORONAVIRUS 2 (TAT 6-24 HRS) Nasopharyngeal Nasopharyngeal Swab     Status: None   Collection Time: 05/30/20 11:53 PM   Specimen: Nasopharyngeal Swab  Result Value Ref Range Status   SARS Coronavirus 2 NEGATIVE NEGATIVE Final    Comment: (NOTE) SARS-CoV-2 target nucleic acids are NOT DETECTED.  The SARS-CoV-2 RNA is generally detectable in upper and lower respiratory specimens during the acute phase of infection. Negative results do not preclude SARS-CoV-2 infection, do not rule out co-infections with  other pathogens, and should not be used as the sole basis for treatment or other patient management decisions. Negative results must be combined with clinical observations, patient history, and epidemiological information. The expected result is Negative.  Fact Sheet for Patients: 06/01/20  Fact Sheet for Healthcare Providers: HairSlick.no  This test is not yet approved or cleared by the quierodirigir.com FDA and  has been authorized for detection and/or diagnosis of SARS-CoV-2 by FDA under an Emergency Use Authorization (EUA). This EUA will remain  in effect (meaning this test can be used) for the duration of the COVID-19 declaration under Se ction 564(b)(1) of the Act, 21 U.S.C. section 360bbb-3(b)(1), unless the authorization is terminated or revoked sooner.  Performed at [facility], [address], [place], [place]     RADIOLOGY:  No results found.   CODE STATUS:     Code Status Orders  (From admission, onward)         Start     Ordered   05/31/20 0019  Full code  Continuous        05/31/20 0018        Code Status History    This patient has a current code status  but no historical code status.   Advance Care Planning Activity       TOTAL TIME TAKING CARE OF THIS PATIENT: *35* minutes.    Enedina Finner M.D  [facility]    CC: Primary care physician; Renaee Munda, MD

## 2020-10-09 ENCOUNTER — Other Ambulatory Visit: Payer: Self-pay

## 2020-10-09 ENCOUNTER — Ambulatory Visit
Admission: RE | Admit: 2020-10-09 | Discharge: 2020-10-09 | Disposition: A | Discharge status: Home / Self Care | Payer: Medicaid Other | Source: Ambulatory Visit | Attending: Emergency Medicine | Admitting: Emergency Medicine

## 2020-10-09 VITALS — BP 110/77 | HR 97 | Temp 99.3°F | Resp 18

## 2020-10-09 DIAGNOSIS — Z20822 Contact with and (suspected) exposure to covid-19: Secondary | ICD-10-CM

## 2020-10-09 DIAGNOSIS — B349 Viral infection, unspecified: Secondary | ICD-10-CM | POA: Diagnosis not present

## 2020-10-09 NOTE — ED Triage Notes (Signed)
Pt c/o cough, headache, fatigue, fever, and body aches since Monday.

## 2020-10-09 NOTE — ED Provider Notes (Signed)
Renaldo Fiddler    CSN: [id number] Arrival date & time: 10/09/20  1831      History   Chief Complaint Chief Complaint  Patient presents with   Cough     HPI Kimsey Zahki Hoogendoorn is a 20 y.o. male.  Presents with 2-day history of fever, body aches, fatigue, headache, nonproductive cough.  He states he felt warm at home but did not take his temperature.  No treatments attempted at home.  He denies rash, shortness of breath, vomiting, diarrhea, or other symptoms.  His medical history includes multiple sclerosis, asthma, prediabetes, obesity.  The history is provided by the patient and medical records.   Past Medical History:  Diagnosis Date   Asthma    Prediabetes     Patient Active Problem List   Diagnosis Date Noted   Weakness 05/31/2020   Fasciculation 05/31/2020   Obesity, Class III, BMI 40-49.9 (morbid obesity) (HCC) 05/31/2020   Multiple sclerosis (HCC)    Left facial numbness     History reviewed. No pertinent surgical history.     Home Medications    Prior to Admission medications   Medication Sig Start Date End Date Taking? Authorizing Provider  chlorproMAZINE (THORAZINE) 10 MG tablet Take 1 tablet (10 mg total) by mouth once as needed for hiccoughs. 06/03/20   Enedina Finner, MD  loratadine (CLARITIN) 10 MG tablet Take 10 mg by mouth daily as needed for allergies.    [provider]    Family History History reviewed. No pertinent family history.  Social History Social History   Tobacco Use   Smoking status: Never   Smokeless tobacco: Never  Substance Use Topics   Alcohol use: No    Alcohol/week: 0.0 standard drinks   Drug use: Never     Allergies   Patient has no known allergies.   Review of Systems Review of Systems  Constitutional:  Positive for fatigue and fever. Negative for chills.  HENT:  Negative for ear pain and sore throat.   Respiratory:  Positive for cough. Negative for shortness of breath.   Cardiovascular:   Negative for chest pain and palpitations.  Gastrointestinal:  Negative for abdominal pain and vomiting.  Skin:  Negative for color change and rash.  Neurological:  Positive for headaches. Negative for syncope.  All other systems reviewed and are negative.   Physical Exam Triage Vital Signs ED Triage Vitals  Enc Vitals Group     BP      Pulse      Resp      Temp      Temp src      SpO2      Weight      Height      Head Circumference      Peak Flow      Pain Score      Pain Loc      Pain Edu?      Excl. in GC?    No data found.  Updated Vital Signs BP 110/77 (BP Location: Left Arm)   Pulse 97   Temp 99.3 F (37.4 C) (Oral)   Resp 18   SpO2 96%   Visual Acuity Right Eye Distance:   Left Eye Distance:   Bilateral Distance:    Right Eye Near:   Left Eye Near:    Bilateral Near:     Physical Exam Vitals and nursing note reviewed.  Constitutional:      General: He is not in acute distress.  Appearance: He is well-developed. He is obese. He is not ill-appearing.  HENT:     Head: Normocephalic and atraumatic.     Right Ear: Tympanic membrane normal.     Left Ear: Tympanic membrane normal.     Nose: Nose normal.     Mouth/Throat:     Mouth: Mucous membranes are moist.     Pharynx: Oropharynx is clear.  Eyes:     Conjunctiva/sclera: Conjunctivae normal.  Cardiovascular:     Rate and Rhythm: Normal rate and regular rhythm.     Heart sounds: Normal heart sounds.  Pulmonary:     Effort: Pulmonary effort is normal. No respiratory distress.     Breath sounds: Normal breath sounds.  Abdominal:     Palpations: Abdomen is soft.     Tenderness: There is no abdominal tenderness.  Musculoskeletal:     Cervical back: Neck supple.  Skin:    General: Skin is warm and dry.  Neurological:     General: No focal deficit present.     Mental Status: He is alert and oriented to person, place, and time.     Gait: Gait normal.  Psychiatric:        Mood and Affect: Mood  normal.        Behavior: Behavior normal.     UC Treatments / Results  Labs (all labs ordered are listed, but only abnormal results are displayed) Labs Reviewed  NOVEL CORONAVIRUS, NAA    EKG   Radiology No results found.  Procedures Procedures (including critical care time)  Medications Ordered in UC Medications - No data to display  Initial Impression / Assessment and Plan / UC Course  I have reviewed the triage vital signs and the nursing notes.  Pertinent labs & imaging results that were available during my care of the patient were reviewed by me and considered in my medical decision making (see chart for details).   Viral illness.  COVID pending.  Instructed patient to self quarantine per CDC guidelines.  Discussed symptomatic treatment including Tylenol or ibuprofen, rest, hydration.  Instructed patient to follow up with PCP if symptoms are not improving.  Patient agrees to plan of care.    Final Clinical Impressions(s) / UC Diagnoses   Final diagnoses:  Encounter for screening laboratory testing for COVID-19 virus  Viral illness     Discharge Instructions      Your COVID test is pending.  You should self quarantine until the test result is back.    Take Tylenol or ibuprofen as needed for fever or discomfort.  Rest and keep yourself hydrated.    Follow-up with your primary care provider if your symptoms are not improving.         ED Prescriptions   None    PDMP not reviewed this encounter.   Mickie Bail, NP 10/09/20 1902

## 2020-10-09 NOTE — Discharge Instructions (Addendum)
Your COVID test is pending.  You should self quarantine until the test result is back.    Take Tylenol or ibuprofen as needed for fever or discomfort.  Rest and keep yourself hydrated.    Follow-up with your primary care provider if your symptoms are not improving.     

## 2020-10-11 LAB — SARS-COV-2, NAA 2 DAY TAT

## 2020-10-11 LAB — NOVEL CORONAVIRUS, NAA: SARS-CoV-2, NAA: DETECTED — AB

## 2020-11-04 ENCOUNTER — Other Ambulatory Visit: Payer: Self-pay

## 2020-11-04 ENCOUNTER — Ambulatory Visit
Admission: RE | Admit: 2020-11-04 | Discharge: 2020-11-04 | Disposition: A | Discharge status: Home / Self Care | Payer: Managed Care, Other (non HMO) | Source: Ambulatory Visit | Attending: Emergency Medicine | Admitting: Emergency Medicine

## 2020-11-04 VITALS — BP 118/76 | HR 79 | Temp 98.5°F | Resp 18

## 2020-11-04 DIAGNOSIS — Z8616 Personal history of COVID-19: Secondary | ICD-10-CM | POA: Diagnosis not present

## 2020-11-04 DIAGNOSIS — R058 Other specified cough: Secondary | ICD-10-CM

## 2020-11-04 MED ORDER — ALBUTEROL SULFATE HFA 108 (90 BASE) MCG/ACT IN AERS
1.0000 | INHALATION_SPRAY | Freq: Four times a day (QID) | RESPIRATORY_TRACT | 0 refills | Status: DC | PRN
Start: 2020-11-04 — End: 2022-09-27

## 2020-11-04 MED ORDER — PREDNISONE 10 MG PO TABS: ORAL_TABLET | ORAL | 0 refills | Status: AC

## 2020-11-04 NOTE — Discharge Instructions (Addendum)
Take your prednisone and use your albuterol inhaler as prescribed.  Rest, push lots of fluids (especially water), and utilize supportive care for symptoms. You may take take acetaminophen (Tylenol) every 4-6 hours or ibuprofen every 6-8 hours for muscle pain, joint pain, headaches. Mucinex (guaifenesin) may be taken over the counter for cough as needed and can loosen phlegm. Please read the instructions and take as directed. Saline nasal sprays to rinse congestion can help as well. Warm tea with lemon and honey can sooth sore throat and cough, as can cough drops.  Return to clinic for new-onset fever, difficulty breathing, chest pain, symptoms lasting >3 to 4 weeks, or bloody sputum.

## 2020-11-04 NOTE — ED Provider Notes (Addendum)
CHIEF COMPLAINT:   Chief Complaint  Patient presents with   Cough     SUBJECTIVE/HPI:  HPI A very pleasant 20 y.o.Male presents today with continued cough and chest congestion after being diagnosed with COVID 21 weeks ago. Patient does not report any shortness of breath, chest pain, palpitations, visual changes, weakness, tingling, headache, nausea, vomiting, diarrhea, fever, chills.   has a past medical history of Asthma and Prediabetes.  ROS:  Review of Systems See Subjective/HPI Medications, Allergies and Problem List personally reviewed in Epic today OBJECTIVE:   Vitals:   11/04/20 1408  BP: 118/76  Pulse: 79  Resp: 18  Temp: 98.5 F (36.9 C)  SpO2: 96%    Physical Exam   General: Appears well-developed and well-nourished. No acute distress.  HEENT Head: Normocephalic and atraumatic.   Ears: Hearing grossly intact, no drainage or visible deformity.  Nose: No nasal deviation.   Mouth/Throat: No stridor or tracheal deviation.  Non erythematous posterior pharynx noted with clear drainage present.  No white patchy exudate noted. Eyes: Conjunctivae and EOM are normal. No eye drainage or scleral icterus bilaterally.  Neck: Normal range of motion, neck is supple.  Cardiovascular: Normal rate. Regular rhythm; no murmurs, gallops, or rubs.  Pulm/Chest: No respiratory distress. Breath sounds normal bilaterally without wheezes, rhonchi, or rales.  Intermittent forceful cough noted. Neurological: Alert and oriented to person, place, and time.  Skin: Skin is warm and dry.  No rashes, lesions, abrasions or bruising noted to skin.   Psychiatric: Normal mood, affect, behavior, and thought content.   Vital signs and nursing note reviewed.   Patient stable and cooperative with examination. PROCEDURES:    LABS/X-RAYS/EKG/MEDS:   No results found for any visits on 11/04/20.  MEDICAL DECISION MAKING:   Patient presents with continued cough and chest congestion after being  diagnosed with COVID 21 weeks ago. Patient does not report any shortness of breath, chest pain, palpitations, visual changes, weakness, tingling, headache, nausea, vomiting, diarrhea, fever, chills.  Given symptoms along with assessment findings, likely postviral cough syndrome after COVID-19.  Rx'd a low-dose prednisone taper to the patient's preferred pharmacy along with an albuterol inhaler to help with symptom management.  No concern at this time for underlying bacterial etiology which would require antibiotics.  Likely cause of the patient's symptoms is inflammatory.  Care which includes rest, fluids, Tylenol, ibuprofen and Mucinex.  Return to clinic for any new onset fever, difficulty breathing, chest pain, symptoms lasting longer than 3 to 4 weeks or bloody sputum.  Patient verbalized understanding and agreed with treatment plan.  Patient stable upon discharge. ASSESSMENT/PLAN:  1. Post-viral cough syndrome - predniSONE (DELTASONE) 10 MG tablet; Take 3 tablets (30 mg total) by mouth daily with breakfast for 2 days, THEN 2 tablets (20 mg total) daily with breakfast for 2 days, THEN 1 tablet (10 mg total) daily with breakfast for 2 days.  Dispense: 12 tablet; Refill: 0 - albuterol (VENTOLIN HFA) 108 (90 Base) MCG/ACT inhaler; Inhale 1-2 puffs into the lungs every 6 (six) hours as needed (cough).  Dispense: 6.7 g; Refill: 0  2. History of COVID-19 Instructions about new medications and side effects provided.  Plan:   Discharge Instructions      Take your prednisone and use your albuterol inhaler as prescribed.  Rest, push lots of fluids (especially water), and utilize supportive care for symptoms. You may take take acetaminophen (Tylenol) every 4-6 hours or ibuprofen every 6-8 hours for muscle pain, joint pain, headaches. Mucinex (guaifenesin)  may be taken over the counter for cough as needed and can loosen phlegm. Please read the instructions and take as directed. Saline nasal sprays to rinse  congestion can help as well. Warm tea with lemon and honey can sooth sore throat and cough, as can cough drops.  Return to clinic for new-onset fever, difficulty breathing, chest pain, symptoms lasting >3 to 4 weeks, or bloody sputum.          Amalia Greenhouse, FNP 11/04/20 1437    Amalia Greenhouse, FNP 11/04/20 1439

## 2020-11-04 NOTE — ED Triage Notes (Signed)
Pt here post-covid x 3 weeks with Cough that is lingering and still having chest congestion.

## 2021-02-24 ENCOUNTER — Other Ambulatory Visit: Payer: Self-pay

## 2021-02-24 ENCOUNTER — Emergency Department
Admission: EM | Admit: 2021-02-24 | Discharge: 2021-02-24 | Disposition: A | Discharge status: Home / Self Care | Payer: Medicaid Other | Attending: Emergency Medicine | Admitting: Emergency Medicine

## 2021-02-24 DIAGNOSIS — F332 Major depressive disorder, recurrent severe without psychotic features: Secondary | ICD-10-CM | POA: Diagnosis present

## 2021-02-24 DIAGNOSIS — F419 Anxiety disorder, unspecified: Secondary | ICD-10-CM | POA: Insufficient documentation

## 2021-02-24 DIAGNOSIS — J45909 Unspecified asthma, uncomplicated: Secondary | ICD-10-CM | POA: Insufficient documentation

## 2021-02-24 DIAGNOSIS — F32A Depression, unspecified: Secondary | ICD-10-CM | POA: Diagnosis present

## 2021-02-24 LAB — CBC
HCT: 44.7 % (ref 39.0–52.0)
Hemoglobin: 15.1 g/dL (ref 13.0–17.0)
MCH: 29.4 pg (ref 26.0–34.0)
MCHC: 33.8 g/dL (ref 30.0–36.0)
MCV: 87.1 fL (ref 80.0–100.0)
Platelets: 428 10*3/uL — ABNORMAL HIGH (ref 150–400)
RBC: 5.13 MIL/uL (ref 4.22–5.81)
RDW: 13.9 % (ref 11.5–15.5)
WBC: 5.7 10*3/uL (ref 4.0–10.5)
nRBC: 0 % (ref 0.0–0.2)

## 2021-02-24 LAB — COMPREHENSIVE METABOLIC PANEL
ALT: 45 U/L — ABNORMAL HIGH (ref 0–44)
AST: 30 U/L (ref 15–41)
Albumin: 3.9 g/dL (ref 3.5–5.0)
Alkaline Phosphatase: 107 U/L (ref 38–126)
Anion gap: 8 (ref 5–15)
BUN: 10 mg/dL (ref 6–20)
CO2: 22 mmol/L (ref 22–32)
Calcium: 8.8 mg/dL — ABNORMAL LOW (ref 8.9–10.3)
Chloride: 107 mmol/L (ref 98–111)
Creatinine, Ser: 0.96 mg/dL (ref 0.61–1.24)
GFR, Estimated: >60 mL/min (ref >60)
Glucose, Bld: 90 mg/dL (ref 70–99)
Potassium: 3.5 mmol/L (ref 3.5–5.1)
Sodium: 137 mmol/L (ref 135–145)
Total Bilirubin: 0.7 mg/dL (ref 0.3–1.2)
Total Protein: 8.3 g/dL — ABNORMAL HIGH (ref 6.5–8.1)

## 2021-02-24 NOTE — ED Provider Notes (Signed)
[facility] Provider Note    Event Date/Time   First MD Initiated Contact with Patient 02/24/21 1739     (approximate)   History   Psychiatric Evaluation   HPI  Elzie Min Collymore is a 21 y.o. male past medical history of MS, asthma and prediabetes who presents with anxiety and depression.  Patient has been feeling this way for some time.  He says that he is not have any suicidal ideation at this time, no plan to harm himself denies drug or alcohol use.  Patient was actually seeing neurology today for his MS who referred him down here given the depression he is having.    Past Medical History:  Diagnosis Date   Asthma    Prediabetes     Patient Active Problem List   Diagnosis Date Noted   MDD (major depressive disorder), recurrent episode, severe (HCC) 02/24/2021   Weakness 05/31/2020   Fasciculation 05/31/2020   Obesity, Class III, BMI 40-49.9 (morbid obesity) (HCC) 05/31/2020   Multiple sclerosis (HCC)    Left facial numbness      Physical Exam  Triage Vital Signs: ED Triage Vitals  Enc Vitals Group     BP 02/24/21 1712 118/74     Pulse Rate 02/24/21 1712 84     Resp 02/24/21 1712 18     Temp 02/24/21 1712 99.6 F (37.6 C)     Temp Source 02/24/21 1712 Oral     SpO2 02/24/21 1712 97 %     Weight 02/24/21 1713 280 lb (127 kg)     Height 02/24/21 1713 5\' 6"  (1.676 m)     Head Circumference --      Peak Flow --      Pain Score 02/24/21 1712 0     Pain Loc --      Pain Edu? --      Excl. in [place]? --     Most recent vital signs: Vitals:   02/24/21 1712 02/24/21 2018  BP: 118/74 121/77  Pulse: 84 85  Resp: 18 17  Temp: 99.6 F (37.6 C)   SpO2: 97% 99%     General: Awake, no distress.  CV:  Good peripheral perfusion.  Resp:  Normal effort.  Abd:  No distention.  Neuro:             Awake, Alert, Oriented x 3  Other:  Patient has somewhat flat affect, good insight to his condition, no suicidal ideation   ED Results /  Procedures / Treatments  Labs (all labs ordered are listed, but only abnormal results are displayed) Labs Reviewed  COMPREHENSIVE METABOLIC PANEL - Abnormal; Notable for the following components:      Result Value   Calcium 8.8 (*)    Total Protein 8.3 (*)    ALT 45 (*)    All other components within normal limits  CBC - Abnormal; Notable for the following components:   Platelets 428 (*)    All other components within normal limits  URINE DRUG SCREEN, QUALITATIVE ([facility] ONLY)  ACETAMINOPHEN LEVEL  ETHANOL  SALICYLATE LEVEL     EKG     RADIOLOGY     MEDICATIONS ORDERED IN ED: Medications - No data to display   IMPRESSION / MDM / ASSESSMENT AND PLAN / ED COURSE  I reviewed the triage vital signs and the nursing notes.  Differential diagnosis includes, but is not limited to, depression  21 year old male with a history of MS he was referred to the ED by neurology due to ongoing depressive thoughts.  Patient tells me he has had suicidal thoughts in the past but is not actively having SI.  He has had depression and anxiety for some time is not on any medication for does not have a psychiatrist but they are working to set this up.  He has no other medical complaints today.  Did not place under IVC as I do not think he is an imminent risk to himself.  Did ask psychiatry to see and he was evaluated by psychiatry and felt to be appropriate for outpatient management.  Given resources.  Stable for discharge.      FINAL CLINICAL IMPRESSION(S) / ED DIAGNOSES   Final diagnoses:  Depression, unspecified depression type     Rx / DC Orders   ED Discharge Orders     None        Note:  This document was prepared using Dragon voice recognition software and may include unintentional dictation errors.   Georga Hacking, MD 02/24/21 [phone]

## 2021-02-24 NOTE — ED Notes (Signed)

## 2021-02-24 NOTE — Consult Note (Signed)
[facility] Face-to-Face Psychiatry Consult   Reason for Consult: Psychiatric Evaluation Referring Physician: Dr. Sidney Ace Patient Identification: Nathaniel Fowler MRN:  [id number] Principal Diagnosis: <principal problem not specified> Diagnosis:  Active Problems:   Obesity, Class III, BMI 40-49.9 (morbid obesity) (HCC)   MDD (major depressive disorder), recurrent episode, severe (HCC)   Total Time spent with patient: 1 hour  Subjective: "I am not suicidal. I was earlier today."  Nathaniel Fowler is a 21 y.o. male patient presented to  [facility] ED via POV and voluntary. Per the ED triage nurse's note, the patient is seen for psychiatric evaluation and reports worsening feelings of depression with anxiety and some suicidal ideation earlier today. The patient shared he was not experiencing suicidal ideation during his assessment. The patient discussed last SI was a month ago without a plan. The patient discussed attending [employer] but recently stopped for the semester. He shared plans of return. The patient discussed that he is not employed currently but is looking for employment. He discussed living at home with his mom and two younger siblings. He denies self-injurious behavior, substance abuse, and alcohol use. The patient was seen face-to-face by this provider; the chart was reviewed and consulted with Dr. Sidney Ace on 02/24/2021 due to the patient's care. It was discussed with the EDP that the patient does not meet the criteria to be admitted to the psychiatric inpatient unit.  On evaluation, the patient is alert and oriented x 4; the patient stutters calm, cooperative, and mood-congruent with affect. The patient does not appear to be responding to internal or external stimuli. Neither is the patient presenting with any delusional thinking. The patient denies auditory or visual hallucinations. The patient denies any suicidal, homicidal, or self-harm ideations. The patient is not presenting  with any psychotic or paranoid behaviors. During an encounter with the patient, he could answer questions appropriately.  HPI:  Per Dr. Sidney Ace, Nathaniel Fowler is a 21 y.o. male past medical history of MS, asthma and prediabetes who presents with anxiety and depression.  Patient has been feeling this way for some time.  He says that he is not have any suicidal ideation at this time, no plan to harm himself denies drug or alcohol use.  Patient was actually seeing neurology today for his MS who referred him down here given the depression he is having.  Past Psychiatric History: History reviewed. No pertinent past psychiatric history  Risk to Self:   Risk to Others:   Prior Inpatient Therapy:   Prior Outpatient Therapy:    Past Medical History:  Past Medical History:  Diagnosis Date   Asthma    Prediabetes    History reviewed. No pertinent surgical history. Family History:  Family History  Family history unknown: Yes   Family Psychiatric  History:  Social History:  Social History   Substance and Sexual Activity  Alcohol Use No   Alcohol/week: 0.0 standard drinks     Social History   Substance and Sexual Activity  Drug Use Never    Social History   Socioeconomic History   Marital status: Single    Spouse name: Not on file   Number of children: Not on file   Years of education: Not on file   Highest education level: Not on file  Occupational History   Not on file  Tobacco Use   Smoking status: Never   Smokeless tobacco: Never  Substance and Sexual Activity   Alcohol use: No    Alcohol/week: 0.0 standard  drinks   Drug use: Never   Sexual activity: Not on file  Other Topics Concern   Not on file  Social History Narrative   Not on file   Social Determinants of Health   Financial Resource Strain: Not on file  Food Insecurity: Not on file  Transportation Needs: Not on file  Physical Activity: Not on file  Stress: Not on file  Social Connections: Not on  file   Additional Social History:    Allergies:  No Known Allergies  Labs:  Results for orders placed or performed during the hospital encounter of 02/24/21 (from the past 48 hour(s))  Comprehensive metabolic panel     Status: Abnormal   Collection Time: 02/24/21  5:16 PM  Result Value Ref Range   Sodium 137 135 - 145 mmol/L   Potassium 3.5 3.5 - 5.1 mmol/L   Chloride 107 98 - 111 mmol/L   CO2 22 22 - 32 mmol/L   Glucose, Bld 90 70 - 99 mg/dL    Comment: Glucose reference range applies only to samples taken after fasting for at least 8 hours.   BUN 10 6 - 20 mg/dL   Creatinine, Ser 4.82 0.61 - 1.24 mg/dL   Calcium 8.8 (L) 8.9 - 10.3 mg/dL   Total Protein 8.3 (H) 6.5 - 8.1 g/dL   Albumin 3.9 3.5 - 5.0 g/dL   AST 30 15 - 41 U/L   ALT 45 (H) 0 - 44 U/L   Alkaline Phosphatase 107 38 - 126 U/L   Total Bilirubin 0.7 0.3 - 1.2 mg/dL   GFR, Estimated >50 >03 mL/min    Comment: (NOTE) Calculated using the CKD-EPI Creatinine Equation (2021)    Anion gap 8 5 - 15    Comment: Performed at [facility], [address] [place], [place] [address]  cbc     Status: Abnormal   Collection Time: 02/24/21  5:16 PM  Result Value Ref Range   WBC 5.7 4.0 - 10.5 K/uL   RBC 5.13 4.22 - 5.81 MIL/uL   Hemoglobin 15.1 13.0 - 17.0 g/dL   HCT 89.1 69.4 - 50.3 %   MCV 87.1 80.0 - 100.0 fL   MCH 29.4 26.0 - 34.0 pg   MCHC 33.8 30.0 - 36.0 g/dL   RDW 88.8 28.0 - 03.4 %   Platelets 428 (H) 150 - 400 K/uL   nRBC 0.0 0.0 - 0.2 %    Comment: Performed at [facility], [address] [place], [place] [address]    No current facility-administered medications for this encounter.   Current Outpatient Medications  Medication Sig Dispense Refill   KESIMPTA 20 MG/0.4ML SOAJ Inject 20 mg into the skin every 30 (thirty) days.     albuterol (VENTOLIN HFA) 108 (90 Base) MCG/ACT inhaler Inhale 1-2 puffs into the lungs every 6 (six) hours as needed (cough). 6.7 g 0   chlorproMAZINE  (THORAZINE) 10 MG tablet Take 1 tablet (10 mg total) by mouth once as needed for hiccoughs. 10 tablet 0   loratadine (CLARITIN) 10 MG tablet Take 10 mg by mouth daily as needed for allergies.      Musculoskeletal: Strength & Muscle Tone: within normal limits Gait & Station: normal Patient leans: N/A  Psychiatric Specialty Exam:  Presentation  General Appearance: Appropriate for Environment  Eye Contact:Good  Speech:Clear and Coherent  Speech Volume:Normal  Handedness:Right   Mood and Affect  Mood:Anxious  Affect:Appropriate; Congruent   Thought Process  Thought Processes:Coherent  Descriptions  of Associations:Intact  Orientation:Full (Time, Place and Person)  Thought Content:Logical  History of Schizophrenia/Schizoaffective disorder:No data recorded Duration of Psychotic Symptoms:No data recorded Hallucinations:Hallucinations: None  Ideas of Reference:None  Suicidal Thoughts:Suicidal Thoughts: No  Homicidal Thoughts:Homicidal Thoughts: No   Sensorium  Memory:Immediate Good; Recent Good; Remote Good  Judgment:Fair  Insight:Good   Executive Functions  Concentration:Good  Attention Span:Good  Recall:Good  Fund of Knowledge:Good  Language:Good   Psychomotor Activity  Psychomotor Activity:Psychomotor Activity: Normal   Assets  Assets:Communication Skills; Desire for Improvement; Social Support   Sleep  Sleep:Sleep: Good Number of Hours of Sleep: 8   Physical Exam: Physical Exam Vitals and nursing note reviewed.  Constitutional:      Appearance: Normal appearance. He is obese.  HENT:     Head: Normocephalic and atraumatic.     Nose: Nose normal.     Mouth/Throat:     Mouth: Mucous membranes are moist.  Cardiovascular:     Rate and Rhythm: Normal rate.     Pulses: Normal pulses.  Pulmonary:     Effort: Pulmonary effort is normal.  Musculoskeletal:        General: Normal range of motion.     Cervical back: Normal range of  motion and neck supple.  Neurological:     General: No focal deficit present.     Mental Status: He is alert and oriented to person, place, and time. Mental status is at baseline.  Psychiatric:        Attention and Perception: Attention and perception normal.        Mood and Affect: Mood normal.        Speech: Speech normal.        Behavior: Behavior normal. Behavior is cooperative.        Thought Content: Thought content normal.        Cognition and Memory: Cognition and memory normal.        Judgment: Judgment normal.   ROS Blood pressure 121/77, pulse 85, temperature 99.6 F (37.6 C), temperature source Oral, resp. rate 17, height 5\' 6"  (1.676 m), weight 127 kg, SpO2 99 %. Body mass index is 45.19 kg/m.  Treatment Plan Summary: Plan Patient does not meet criteria for psychiatric inpatient admission  Disposition: No evidence of imminent risk to self or others at present.   Patient does not meet criteria for psychiatric inpatient admission. Supportive therapy provided about ongoing stressors. Discussed crisis plan, support from social network, calling 911, coming to the Emergency Department, and calling Suicide Hotline.  , NP 02/24/2021 11:11 PM

## 2021-02-24 NOTE — ED Triage Notes (Signed)
Patient to ER via POV for psychiatric evaluation. Reports worsening feelings of depression/ anxiety and some suicidal ideation for some time now. Denies plan.

## 2021-02-24 NOTE — ED Notes (Signed)
VOL/pending psych consult 

## 2021-02-24 NOTE — ED Notes (Signed)
Writer tried twice for blood draw, but unsuccessful

## 2021-02-24 NOTE — ED Notes (Signed)
Pt receive snack

## 2021-02-26 DIAGNOSIS — F32A Depression, unspecified: Secondary | ICD-10-CM | POA: Insufficient documentation

## 2021-05-14 IMAGING — MR MR CERVICAL SPINE WO/W CM
5 of 8 series · 29 of 48 positions shown · IV contrast (gadavist)
Comparison: None.

CLINICAL DATA: Left facial twitching. Possible demyelinating
disease. Abnormal brain MRI.

EXAM:
MRI CERVICAL AND THORACIC SPINE WITHOUT AND WITH CONTRAST
TECHNIQUE: Multiplanar and multiecho pulse sequences of the cervical spine, to
include the craniocervical junction and cervicothoracic junction,
and the thoracic spine, were obtained without and with intravenous
contrast.
CONTRAST:  10mL GADAVIST GADOBUTROL 1 MMOL/ML IV SOLN

[Series 25: T2 · sagittal · 3.0mm · 0.62mm/px · 4 of 15 slices shown (1 of 2)]
[im 1/15]
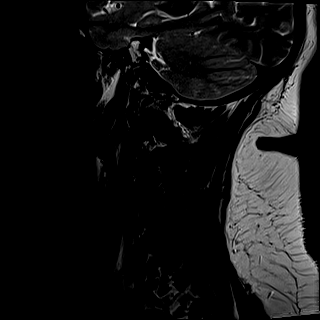
[im 5/15]
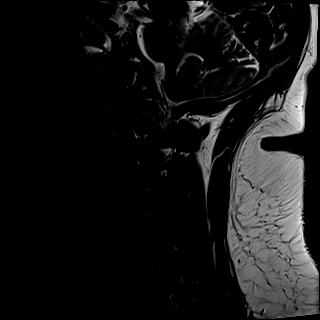
[im 10/15]
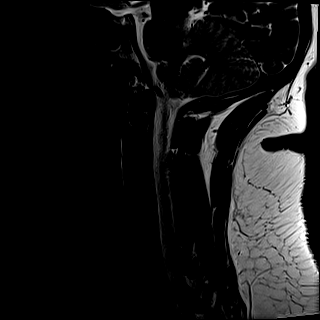
[im 15/15]
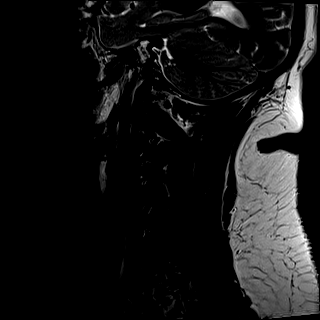

[Series 27: STIR · sagittal · 3.0mm · 0.62mm/px · 4 of 15 slices shown]
[im 1/15]
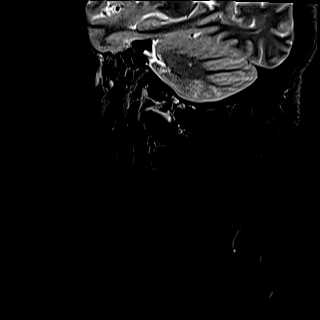
[im 5/15]
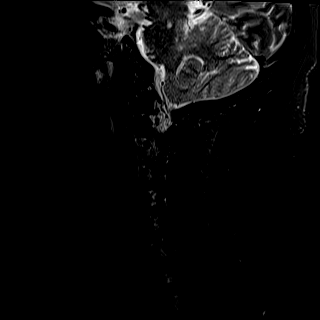
[im 10/15]
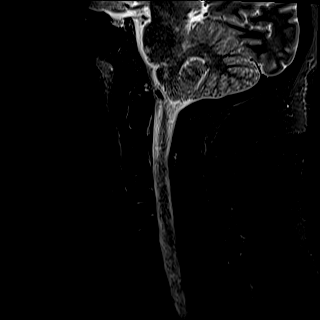
[im 15/15]
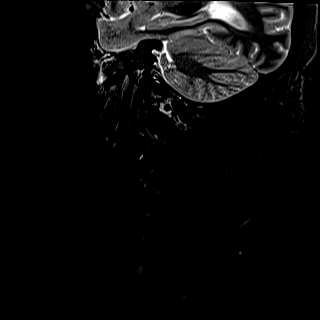

[Series 28: T2 · axial · 3.0mm · 0.70mm/px · z∈[-123,-25]mm · 8 of 29 slices shown (2 of 2)]
[im 1/29]
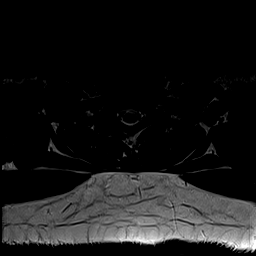
[im 5/29]
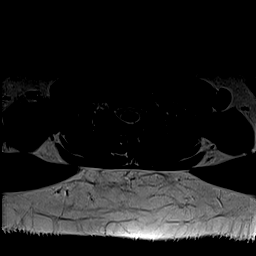
[im 9/29]
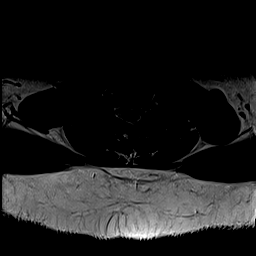
[im 13/29]
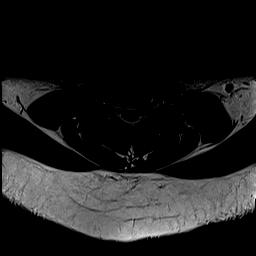
[im 17/29]
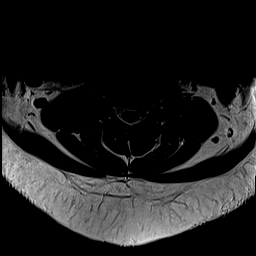
[im 21/29]
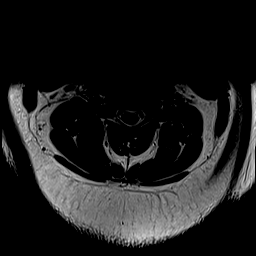
[im 25/29]
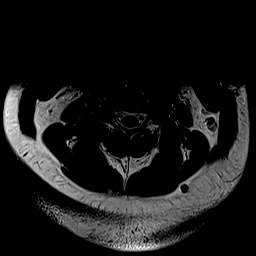
[im 29/29]
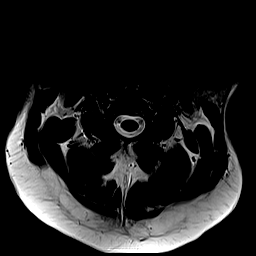

[Series 30: T1 · axial · non-contrast · 3.0mm · 0.35mm/px · z∈[-123,-25]mm · 8 of 29 slices shown]
[im 1/29]
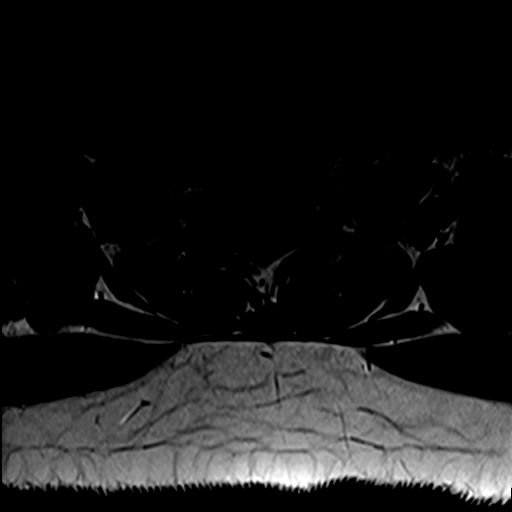
[im 5/29]
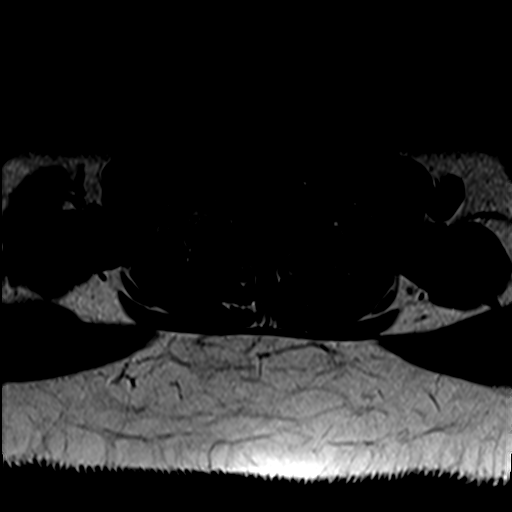
[im 9/29]
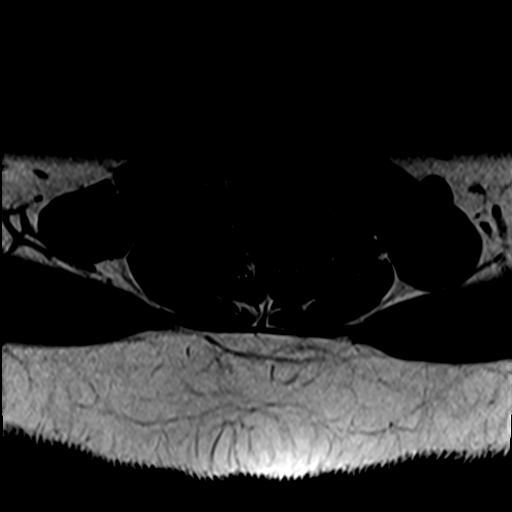
[im 13/29]
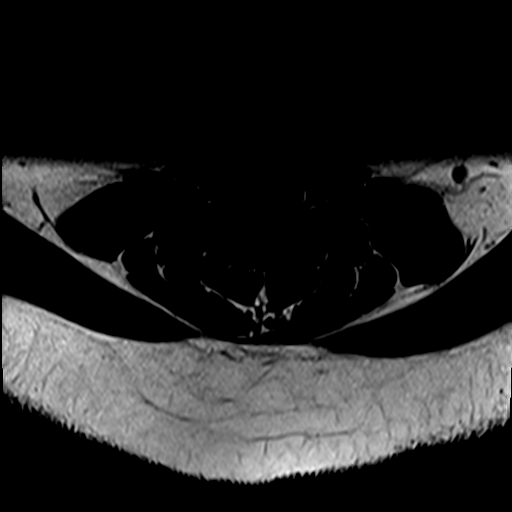
[im 17/29]
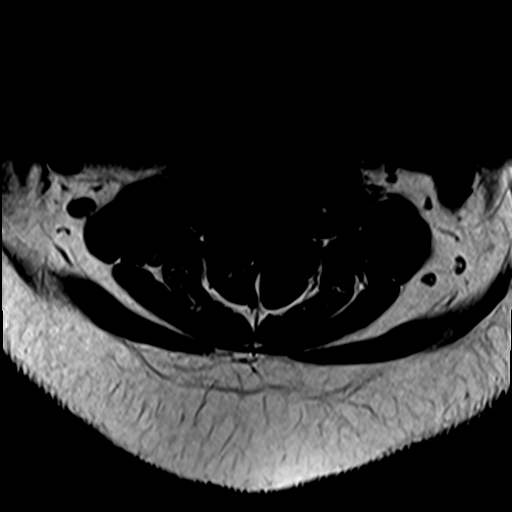
[im 21/29]
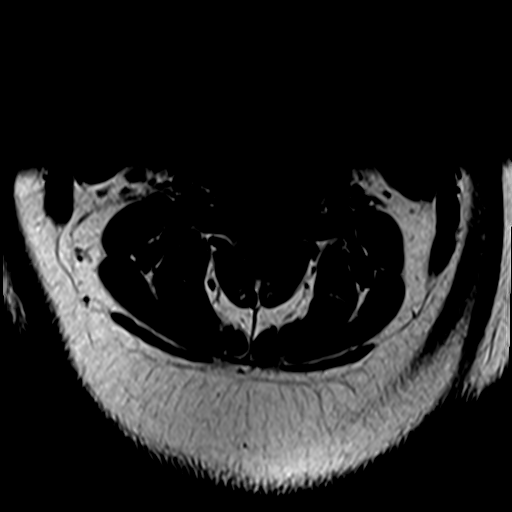
[im 25/29]
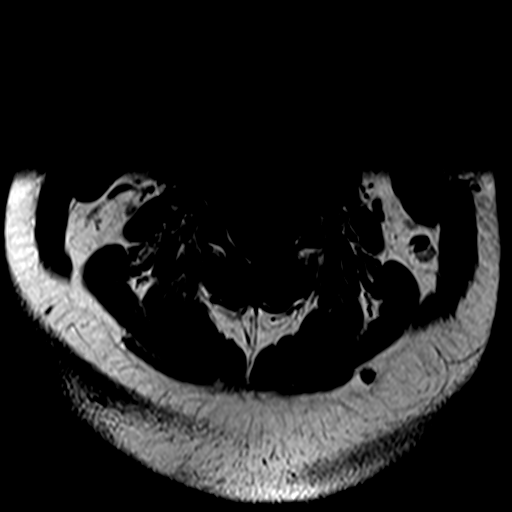
[im 29/29]
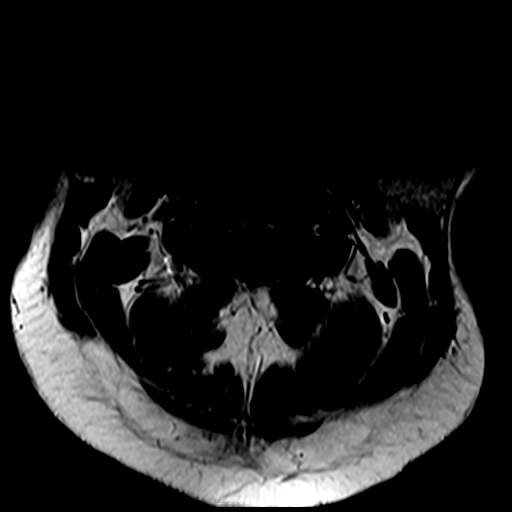

[Series 32: T1 post-contrast · axial · 3.0mm · 0.35mm/px · z∈[-123,-67]mm · 5 of 29 slices shown]
[im 1/29]
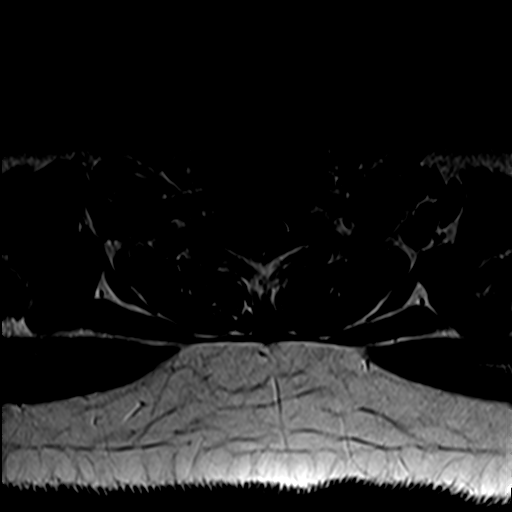
[im 5/29]
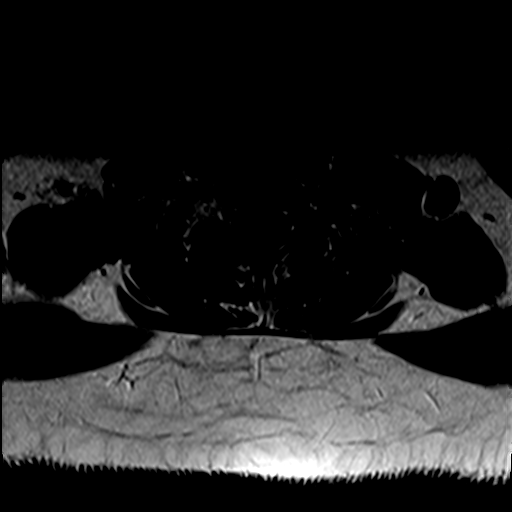
[im 9/29]
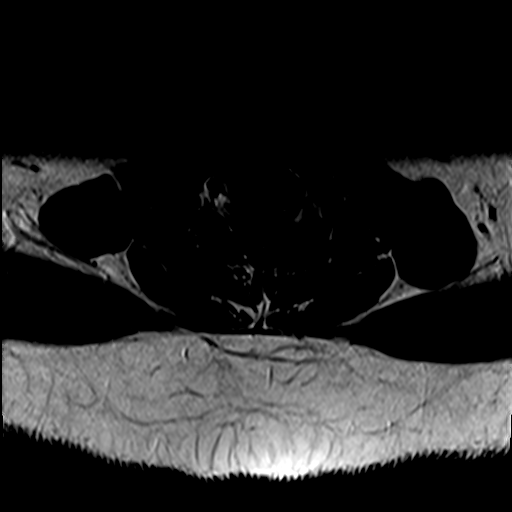
[im 13/29]
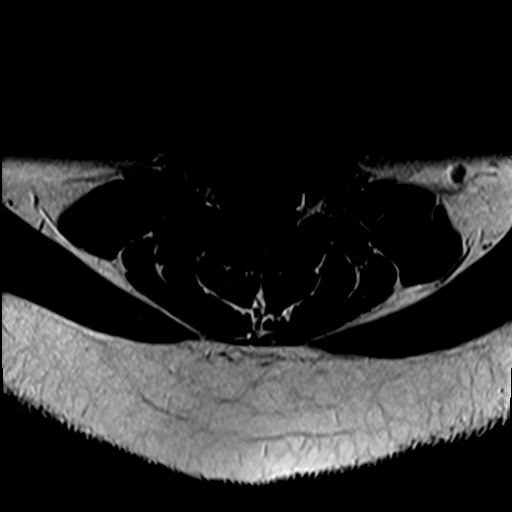
[im 17/29]
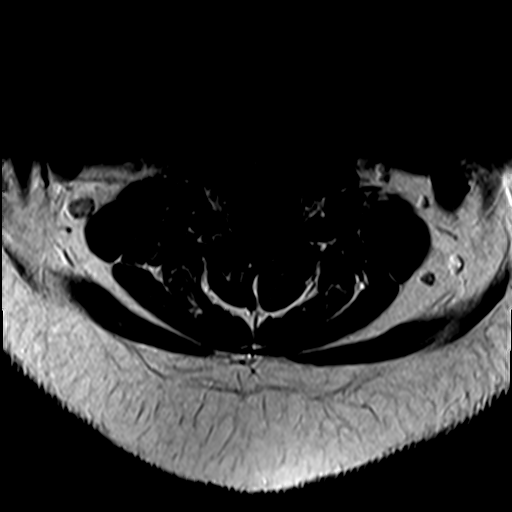

[29 of 48 positions shown; findings below may reference images not displayed]

FINDINGS: MRI CERVICAL SPINE FINDINGS

Alignment: Physiologic.

Vertebrae: No fracture, evidence of discitis, or bone lesion.

Cord: Normal signal and morphology.

Posterior Fossa, vertebral arteries, paraspinal tissues: Negative.

Disc levels:

No spinal canal or neural foraminal stenosis.  No disc herniation.

MRI THORACIC SPINE FINDINGS

Alignment:  Physiologic.

Vertebrae: No fracture, evidence of discitis, or bone lesion.

Cord:  Normal signal and morphology.

Paraspinal and other soft tissues: Negative.

Disc levels:

No spinal canal or neural foraminal stenosis.
IMPRESSION: No evidence of demyelinating disease in the cervical or thoracic
spine.

## 2021-07-24 ENCOUNTER — Other Ambulatory Visit: Payer: Self-pay | Admitting: Neurology

## 2021-07-24 DIAGNOSIS — G35 Multiple sclerosis: Secondary | ICD-10-CM

## 2021-08-05 ENCOUNTER — Ambulatory Visit
Admission: RE | Admit: 2021-08-05 | Discharge: 2021-08-05 | Disposition: A | Discharge status: Home / Self Care | Payer: Medicaid Other | Source: Ambulatory Visit | Attending: Neurology | Admitting: Neurology

## 2021-08-05 ENCOUNTER — Ambulatory Visit
Admission: RE | Admit: 2021-08-05 | Discharge: 2021-08-05 | Disposition: A | Discharge status: Home / Self Care | Payer: Managed Care, Other (non HMO) | Source: Ambulatory Visit | Attending: Neurology | Admitting: Neurology

## 2021-08-05 DIAGNOSIS — G35 Multiple sclerosis: Secondary | ICD-10-CM

## 2021-08-05 MED ORDER — GADOBENATE DIMEGLUMINE 529 MG/ML IV SOLN
20.0000 mL | Freq: Once | INTRAVENOUS | Status: AC | PRN
  Administered 2021-08-05: 20 mL via INTRAVENOUS

## 2021-11-13 ENCOUNTER — Ambulatory Visit
Admission: RE | Admit: 2021-11-13 | Discharge: 2021-11-13 | Disposition: A | Discharge status: Home / Self Care | Payer: Managed Care, Other (non HMO) | Source: Ambulatory Visit | Attending: Urgent Care | Admitting: Urgent Care

## 2021-11-13 VITALS — BP 131/86 | HR 85 | Temp 97.9°F | Resp 16

## 2021-11-13 DIAGNOSIS — L03011 Cellulitis of right finger: Secondary | ICD-10-CM

## 2021-11-13 NOTE — ED Triage Notes (Signed)
Patient presents to UC for abscess located on right middle finger x 2-3 weeks. States tender, no drainage or fever.

## 2021-11-13 NOTE — ED Provider Notes (Signed)
[facility]    CSN: [id number] Arrival date & time: 11/13/21  1304      History   Chief Complaint Chief Complaint  Patient presents with   Abscess    Entered by patient    HPI Nathaniel Fowler is a 21 y.o. male.    Abscess   Patient presents to urgent care with report of "abscess" on his right middle finger x2 to 3 weeks.  Endorses tenderness, no drainage, no fever.  Past Medical History:  Diagnosis Date   Asthma    Prediabetes     Patient Active Problem List   Diagnosis Date Noted   Depression    MDD (major depressive disorder), recurrent episode, severe ([place]) 02/24/2021   Weakness 05/31/2020   Fasciculation 05/31/2020   Obesity, Class III, BMI 40-49.9 (morbid obesity) ([place]) 05/31/2020   Multiple sclerosis (HCC)    Left facial numbness     History reviewed. No pertinent surgical history.     Home Medications    Prior to Admission medications   Medication Sig Start Date End Date Taking? Authorizing Provider  albuterol (VENTOLIN HFA) 108 (90 Base) MCG/ACT inhaler Inhale 1-2 puffs into the lungs every 6 (six) hours as needed (cough). 11/04/20   Boddu, Erasmo [place]er, FNP  chlorproMAZINE (THORAZINE) 10 MG tablet Take 1 tablet (10 mg total) by mouth once as needed for hiccoughs. 06/03/20   Fritzi Mandes, MD  KESIMPTA 20 MG/0.4ML SOAJ Inject 20 mg into the skin every 30 (thirty) days. 02/03/21   [provider]  loratadine (CLARITIN) 10 MG tablet Take 10 mg by mouth daily as needed for allergies.    [provider]    Family History Family History  Family history unknown: Yes    Social History Social History   Tobacco Use   Smoking status: Never   Smokeless tobacco: Never  Substance Use Topics   Alcohol use: No    Alcohol/week: 0.0 standard drinks of alcohol   Drug use: Never     Allergies   Multihance [gadobenate]   Review of Systems Review of Systems   Physical Exam Triage Vital Signs ED Triage Vitals  Enc  Vitals Group     BP 11/13/21 1307 131/86     Pulse Rate 11/13/21 1307 85     Resp 11/13/21 1307 16     Temp 11/13/21 1307 97.9 F (36.6 C)     Temp Source 11/13/21 1307 Temporal     SpO2 11/13/21 1307 96 %     Weight --      Height --      Head Circumference --      Peak Flow --      Pain Score 11/13/21 1310 0     Pain Loc --      Pain Edu? --      Excl. in [place]? --    No data found.  Updated Vital Signs BP 131/86 (BP Location: Left Arm)   Pulse 85   Temp 97.9 F (36.6 C) (Temporal)   Resp 16   SpO2 96%   Visual Acuity Right Eye Distance:   Left Eye Distance:   Bilateral Distance:    Right Eye Near:   Left Eye Near:    Bilateral Near:     Physical Exam Vitals reviewed.  Constitutional:      Appearance: Normal appearance.  Musculoskeletal:       Hands:  Skin:    General: Skin is warm and dry.  Neurological:  General: No focal deficit present.     Mental Status: He is alert and oriented to person, place, and time.  Psychiatric:        Mood and Affect: Mood normal.        Behavior: Behavior normal.      UC Treatments / Results  Labs (all labs ordered are listed, but only abnormal results are displayed) Labs Reviewed - No data to display  EKG   Radiology No results found.  Procedures Incision and Drainage  Date/Time: 11/13/2021 1:36 PM  Performed by: Charma Igo, FNP Authorized by: Charma Igo, FNP   Consent:    Consent obtained:  Verbal   Consent given by:  Patient   Risks, benefits, and alternatives were discussed: yes     Risks discussed:  Bleeding, incomplete drainage and pain   Alternatives discussed:  No treatment Universal protocol:    Procedure explained and questions answered to patient or proxy's satisfaction: yes     Relevant documents present and verified: yes     Test results available : no     Imaging studies available: no     Required blood products, implants, devices, and special equipment available: no      Site/side marked: no     Immediately prior to procedure, a time out was called: yes     Patient identity confirmed:  Verbally with patient Location:    Type:  Abscess   Size:  4 mm   Location:  Upper extremity   Upper extremity location:  Finger   Finger location:  R long finger Pre-procedure details:    Skin preparation:  Povidone-iodine Sedation:    Sedation type:  None Anesthesia:    Anesthesia method:  Topical application   Topical anesthetic:  LET Procedure type:    Complexity:  Simple Procedure details:    Ultrasound guidance: no     Needle aspiration: no     Incision types:  Stab incision   Incision depth:  Dermal   Wound management:  Probed and deloculated   Drainage:  Purulent   Drainage amount:  Moderate   Wound treatment:  Wound left open   Packing materials:  None Post-procedure details:    Procedure completion:  Tolerated  (including critical care time)  Medications Ordered in UC Medications - No data to display  Initial Impression / Assessment and Plan / UC Course  I have reviewed the triage vital signs and the nursing notes.  Pertinent labs & imaging results that were available during my care of the patient were reviewed by me and considered in my medical decision making (see chart for details).   Bronchia of right middle finger.  I&D performed relieving moderate amount of purulent drainage.  Patient is instructed to soak his hand in warm water with Epsom salts twice daily after which he will apply antibiotic ointment and Band-Aid.   Final Clinical Impressions(s) / UC Diagnoses   Final diagnoses:  Paronychia of right middle finger   Discharge Instructions   None    ED Prescriptions   None    PDMP not reviewed this encounter.   Charma Igo, [place] 11/13/21 1341

## 2021-11-13 NOTE — Discharge Instructions (Addendum)
Please soak your hand in warm water with dissolved Epsom salts twice a day.  After soaking, apply antibiotic ointment and a Band-Aid.  Follow up here or with your primary care provider if your symptoms are worsening or not improving with treatment.

## 2022-01-21 ENCOUNTER — Ambulatory Visit
Admission: EM | Admit: 2022-01-21 | Discharge: 2022-01-21 | Disposition: A | Discharge status: Home / Self Care | Payer: Managed Care, Other (non HMO)

## 2022-01-21 DIAGNOSIS — J069 Acute upper respiratory infection, unspecified: Secondary | ICD-10-CM

## 2022-01-21 NOTE — Discharge Instructions (Addendum)
You have been diagnosed with a viral upper respiratory infection based on your symptoms and exam. Viral illnesses cannot be treated with antibiotics - they are self limiting - and you should find your symptoms resolving within a few days. Get plenty of rest and non-caffeinated fluids.  We recommend you use over-the-counter medications for symptom control including Tylenol or ibuprofen for fever, chills or body aches, and cold/cough medication such as Mucinex DM.  Saline mist spray is helpful for removing excess mucus from your nose.  Room humidifiers are helpful to ease breathing at night.   You might also find relief of nasal/sinus congestion symptoms by using a nasal decongestant such as Sudafed sinus (pseudoephedrine).  You will need to obtain this medication from behind the pharmacist counter.  Speak to the pharmacist to verify that you are not duplicating medications with other over-the-counter formulations that you may be using.   Follow up here or with your primary care provider if your symptoms are worsening or not improving.

## 2022-01-21 NOTE — ED Triage Notes (Signed)
Pt. Presents to UC w/ c/o a cough, SOB and nasal congestion for the past 2 days.

## 2022-01-21 NOTE — ED Provider Notes (Signed)
Renaldo Fiddler    CSN: [id number] Arrival date & time: 01/21/22  1812      History   Chief Complaint Chief Complaint  Patient presents with  . Cough  . Shortness of Breath  . Nasal Congestion    HPI Nathaniel Fowler is a 21 y.o. male.    Cough Associated symptoms: shortness of breath   Shortness of Breath Associated symptoms: cough     Presents to urgent care with concern for cough, shortness of breath, nasal congestion x 2 days.  He describes his symptoms of shortness of breath as not being able to breathe through his nose, forced to breathe through his mouth.  Denies fever, chills, myalgias.  Past Medical History:  Diagnosis Date  . Asthma   . Prediabetes     Patient Active Problem List   Diagnosis Date Noted  . Depression   . MDD (major depressive disorder), recurrent episode, severe (HCC) 02/24/2021  . Weakness 05/31/2020  . Fasciculation 05/31/2020  . Obesity, Class III, BMI 40-49.9 (morbid obesity) (HCC) 05/31/2020  . Multiple sclerosis (HCC)   . Left facial numbness     History reviewed. No pertinent surgical history.     Home Medications    Prior to Admission medications   Medication Sig Start Date End Date Taking? Authorizing Provider  albuterol (VENTOLIN HFA) 108 (90 Base) MCG/ACT inhaler Inhale 1-2 puffs into the lungs every 6 (six) hours as needed (cough). 11/04/20   Boddu, Belenda Cruise, FNP  chlorproMAZINE (THORAZINE) 10 MG tablet Take 1 tablet (10 mg total) by mouth once as needed for hiccoughs. 06/03/20   Enedina Finner, MD  KESIMPTA 20 MG/0.4ML SOAJ Inject 20 mg into the skin every 30 (thirty) days. 02/03/21   [provider]  loratadine (CLARITIN) 10 MG tablet Take 10 mg by mouth daily as needed for allergies.    [provider]    Family History Family History  Family history unknown: Yes    Social History Social History   Tobacco Use  . Smoking status: Never  . Smokeless tobacco: Never  Substance Use  Topics  . Alcohol use: No    Alcohol/week: 0.0 standard drinks of alcohol  . Drug use: Never     Allergies   Multihance [gadobenate]   Review of Systems Review of Systems  Respiratory:  Positive for cough and shortness of breath.      Physical Exam Triage Vital Signs ED Triage Vitals  Enc Vitals Group     BP 01/21/22 1845 135/81     Pulse Rate 01/21/22 1845 93     Resp 01/21/22 1845 17     Temp 01/21/22 1845 98.6 F (37 C)     Temp src --      SpO2 01/21/22 1845 97 %     Weight --      Height --      Head Circumference --      Peak Flow --      Pain Score 01/21/22 1846 0     Pain Loc --      Pain Edu? --      Excl. in GC? --    No data found.  Updated Vital Signs BP 135/81   Pulse 93   Temp 98.6 F (37 C)   Resp 17   SpO2 97%   Visual Acuity Right Eye Distance:   Left Eye Distance:   Bilateral Distance:    Right Eye Near:   Left Eye Near:  Bilateral Near:     Physical Exam Vitals reviewed.  Constitutional:      Appearance: He is well-developed.  HENT:     Nose: Congestion present. No rhinorrhea.  Cardiovascular:     Rate and Rhythm: Normal rate and regular rhythm.  Pulmonary:     Effort: Pulmonary effort is normal.     Breath sounds: Normal breath sounds.  Skin:    General: Skin is warm and dry.  Neurological:     General: No focal deficit present.     Mental Status: He is alert and oriented to person, place, and time.  Psychiatric:        Mood and Affect: Mood normal.        Behavior: Behavior normal.     UC Treatments / Results  Labs (all labs ordered are listed, but only abnormal results are displayed) Labs Reviewed - No data to display  EKG   Radiology No results found.  Procedures Procedures (including critical care time)  Medications Ordered in UC Medications - No data to display  Initial Impression / Assessment and Plan / UC Course  I have reviewed the triage vital signs and the nursing notes.  Pertinent labs &  imaging results that were available during my care of the patient were reviewed by me and considered in my medical decision making (see chart for details).   Patient is afebrile here without recent antipyretics. Satting well on room air. Overall is well appearing, well hydrated, without respiratory distress. Pulmonary exam is unremarkable.  Lungs CTAB without wheezes, rhonchi, rales.  Suspect viral process, most likely common cold.  Recommending use of OTC medication for symptom control.  Final Clinical Impressions(s) / UC Diagnoses   Final diagnoses:  None   Discharge Instructions   None    ED Prescriptions   None    PDMP not reviewed this encounter.   Charma Igo, [place] 01/21/22 Windell Moment

## 2022-06-07 ENCOUNTER — Ambulatory Visit: Payer: Self-pay

## 2022-09-27 ENCOUNTER — Ambulatory Visit
Admission: RE | Admit: 2022-09-27 | Discharge: 2022-09-27 | Disposition: A | Discharge status: Home / Self Care | Payer: Managed Care, Other (non HMO) | Source: Ambulatory Visit | Attending: Emergency Medicine | Admitting: Emergency Medicine

## 2022-09-27 VITALS — BP 115/73 | HR 84 | Temp 99.0°F | Resp 18

## 2022-09-27 DIAGNOSIS — B9789 Other viral agents as the cause of diseases classified elsewhere: Secondary | ICD-10-CM | POA: Diagnosis not present

## 2022-09-27 DIAGNOSIS — Z1152 Encounter for screening for COVID-19: Secondary | ICD-10-CM | POA: Insufficient documentation

## 2022-09-27 DIAGNOSIS — R051 Acute cough: Secondary | ICD-10-CM | POA: Diagnosis present

## 2022-09-27 DIAGNOSIS — J069 Acute upper respiratory infection, unspecified: Secondary | ICD-10-CM | POA: Insufficient documentation

## 2022-09-27 MED ORDER — ALBUTEROL SULFATE HFA 108 (90 BASE) MCG/ACT IN AERS: 2.0000 | INHALATION_SPRAY | RESPIRATORY_TRACT | 0 refills | Status: AC | PRN

## 2022-09-27 NOTE — ED Triage Notes (Signed)
Cough, SOB, that started Friday. Pt would like a covid test done for work.

## 2022-09-27 NOTE — Discharge Instructions (Addendum)
Your symptoms today are most likely being caused by a virus and should steadily improve in time it can take up to 7 to 10 days before you truly start to see a turnaround however things will get better    You can take Tylenol and/or Ibuprofen as needed for fever reduction and pain relief.  Use albuterol inhaler taking 2 puffs every 4-6 hours as needed for shortness of breath   For cough: honey 1/2 to 1 teaspoon (you can dilute the honey in water or another fluid).  You can also use guaifenesin and dextromethorphan for cough. You can use a humidifier for chest congestion and cough.  If you don't have a humidifier, you can sit in the bathroom with the hot shower running.      For sore throat: try warm salt water gargles, cepacol lozenges, throat spray, warm tea or water with lemon/honey, popsicles or ice, or OTC cold relief medicine for throat discomfort.   For congestion: take a daily anti-histamine like Zyrtec, Claritin, and a oral decongestant, such as pseudoephedrine.  You can also use Flonase 1-2 sprays in each nostril daily.   It is important to stay hydrated: drink plenty of fluids (water, gatorade/powerade/pedialyte, juices, or teas) to keep your throat moisturized and help further relieve irritation/discomfort.

## 2022-09-27 NOTE — ED Provider Notes (Signed)
Renaldo Fiddler    CSN: [id number] Arrival date & time: 09/27/22  1246      History   Chief Complaint Chief Complaint  Patient presents with   Cough    Need a covid test for work, don't know if it actually is or not. - Entered by patient    HPI Nathaniel Fowler is a 22 y.o. male.   Patient presents for evaluation of a nonproductive cough and shortness of breath at rest beginning 3 days ago.  Known sick contacts requesting COVID testing for work.  Has not attempted treatment of symptoms.  Denies fever chills body aches congestion ear pain sore throat wheezing.  History of childhood asthma.  Tolerating food and liquids.  Past Medical History:  Diagnosis Date   Asthma    Prediabetes     Patient Active Problem List   Diagnosis Date Noted   Depression    MDD (major depressive disorder), recurrent episode, severe (HCC) 02/24/2021   Weakness 05/31/2020   Fasciculation 05/31/2020   Obesity, Class III, BMI 40-49.9 (morbid obesity) (HCC) 05/31/2020   Multiple sclerosis (HCC)    Left facial numbness     History reviewed. No pertinent surgical history.     Home Medications    Prior to Admission medications   Medication Sig Start Date End Date Taking? Authorizing Provider  albuterol (VENTOLIN HFA) 108 (90 Base) MCG/ACT inhaler Inhale 2 puffs into the lungs every 4 (four) hours as needed for wheezing or shortness of breath. 09/27/22  Yes Taaliyah Delpriore R, NP  KESIMPTA 20 MG/0.4ML SOAJ Inject 20 mg into the skin every 30 (thirty) days. 02/03/21  Yes [provider]  chlorproMAZINE (THORAZINE) 10 MG tablet Take 1 tablet (10 mg total) by mouth once as needed for hiccoughs. 06/03/20   Enedina Finner, MD  loratadine (CLARITIN) 10 MG tablet Take 10 mg by mouth daily as needed for allergies.    [provider]    Family History Family History  Family history unknown: Yes    Social History Social History   Tobacco Use   Smoking status: Never    Smokeless tobacco: Never  Substance Use Topics   Alcohol use: No    Alcohol/week: 0.0 standard drinks of alcohol   Drug use: Never     Allergies   Multihance [gadobenate]   Review of Systems Review of Systems  Constitutional: Negative.   HENT: Negative.    Respiratory:  Positive for cough and shortness of breath. Negative for apnea, choking, chest tightness, wheezing and stridor.   Cardiovascular: Negative.   Gastrointestinal: Negative.      Physical Exam Triage Vital Signs ED Triage Vitals  Encounter Vitals Group     BP 09/27/22 1312 115/73     Systolic BP Percentile --      Diastolic BP Percentile --      Pulse Rate 09/27/22 1312 84     Resp 09/27/22 1312 18     Temp 09/27/22 1312 99 F (37.2 C)     Temp Source 09/27/22 1312 Oral     SpO2 09/27/22 1312 97 %     Weight --      Height --      Head Circumference --      Peak Flow --      Pain Score 09/27/22 1315 0     Pain Loc --      Pain Education --      Exclude from Growth Chart --    No  data found.  Updated Vital Signs BP 115/73 (BP Location: Left Arm)   Pulse 84   Temp 99 F (37.2 C) (Oral)   Resp 18   SpO2 97%   Visual Acuity Right Eye Distance:   Left Eye Distance:   Bilateral Distance:    Right Eye Near:   Left Eye Near:    Bilateral Near:     Physical Exam Constitutional:      Appearance: Normal appearance.  HENT:     Head: Normocephalic.     Right Ear: Tympanic membrane, ear canal and external ear normal.     Left Ear: Tympanic membrane, ear canal and external ear normal.     Nose: Congestion present. No rhinorrhea.     Mouth/Throat:     Mouth: Mucous membranes are moist.     Pharynx: Oropharynx is clear.  Eyes:     Extraocular Movements: Extraocular movements intact.  Cardiovascular:     Rate and Rhythm: Normal rate and regular rhythm.     Pulses: Normal pulses.     Heart sounds: Normal heart sounds.  Pulmonary:     Effort: Pulmonary effort is normal.     Breath sounds:  Normal breath sounds.  Skin:    General: Skin is warm and dry.  Neurological:     Mental Status: He is alert and oriented to person, place, and time. Mental status is at baseline.      UC Treatments / Results  Labs (all labs ordered are listed, but only abnormal results are displayed) Labs Reviewed  SARS CORONAVIRUS 2 (TAT 6-24 HRS)    EKG   Radiology No results found.  Procedures Procedures (including critical care time)  Medications Ordered in UC Medications - No data to display  Initial Impression / Assessment and Plan / UC Course  I have reviewed the triage vital signs and the nursing notes.  Pertinent labs & imaging results that were available during my care of the patient were reviewed by me and considered in my medical decision making (see chart for details).  Viral URI with cough, acute cough  Patient is in no signs of distress nor toxic appearing.  Vital signs are stable.  Low suspicion for pneumonia, pneumothorax or bronchitis and therefore will defer imaging.COVID test is pending, reviewed quarantine guidelines per CDC recommendations, healthy adult without comorbidities, does not qualify for antivirals.Prescribed albuterol inhaler.  Declined steroids at this time.May use additional over-the-counter medications as needed for supportive care.  May follow-up with urgent care as needed if symptoms persist or worsen.  Note given.   Final Clinical Impressions(s) / UC Diagnoses   Final diagnoses:  Acute cough  Viral URI with cough     Discharge Instructions      Your symptoms today are most likely being caused by a virus and should steadily improve in time it can take up to 7 to 10 days before you truly start to see a turnaround however things will get better    You can take Tylenol and/or Ibuprofen as needed for fever reduction and pain relief.  Use albuterol inhaler taking 2 puffs every 4-6 hours as needed for shortness of breath   For cough: honey 1/2 to  1 teaspoon (you can dilute the honey in water or another fluid).  You can also use guaifenesin and dextromethorphan for cough. You can use a humidifier for chest congestion and cough.  If you don't have a humidifier, you can sit in the bathroom with the hot shower  running.      For sore throat: try warm salt water gargles, cepacol lozenges, throat spray, warm tea or water with lemon/honey, popsicles or ice, or OTC cold relief medicine for throat discomfort.   For congestion: take a daily anti-histamine like Zyrtec, Claritin, and a oral decongestant, such as pseudoephedrine.  You can also use Flonase 1-2 sprays in each nostril daily.   It is important to stay hydrated: drink plenty of fluids (water, gatorade/powerade/pedialyte, juices, or teas) to keep your throat moisturized and help further relieve irritation/discomfort.    ED Prescriptions     Medication Sig Dispense Auth. Provider   albuterol (VENTOLIN HFA) 108 (90 Base) MCG/ACT inhaler Inhale 2 puffs into the lungs every 4 (four) hours as needed for wheezing or shortness of breath. 36 g Valinda Hoar, NP      PDMP not reviewed this encounter.   Valinda Hoar, NP 09/27/22 1329

## 2022-09-28 LAB — SARS CORONAVIRUS 2 (TAT 6-24 HRS): SARS Coronavirus 2: NEGATIVE

## 2023-02-11 ENCOUNTER — Ambulatory Visit
Admission: RE | Admit: 2023-02-11 | Discharge: 2023-02-11 | Disposition: A | Discharge status: Home / Self Care | Payer: Medicaid Other | Source: Ambulatory Visit | Attending: Family Medicine | Admitting: Family Medicine

## 2023-02-11 VITALS — BP 116/73 | HR 100 | Temp 99.0°F | Resp 18

## 2023-02-11 DIAGNOSIS — J069 Acute upper respiratory infection, unspecified: Secondary | ICD-10-CM | POA: Diagnosis not present

## 2023-02-11 HISTORY — DX: Multiple sclerosis: G35

## 2023-02-11 HISTORY — DX: Multiple sclerosis, unspecified: G35.D

## 2023-02-11 NOTE — Discharge Instructions (Addendum)
 Take Tylenol or ibuprofen as needed for fever or discomfort.  Take plain Mucinex as needed for congestion.  Rest and keep yourself hydrated.    Follow-up with your primary care provider if your symptoms are not improving.

## 2023-02-11 NOTE — ED Triage Notes (Signed)
 Provider triage

## 2023-02-11 NOTE — ED Provider Notes (Signed)
 Nathaniel Fowler    CSN: [id number] Arrival date & time: 02/11/23  1800      History   Chief Complaint Chief Complaint  Patient presents with   Cough    Entered by patient    HPI Nathaniel Fowler is a 23 y.o. male.  Patient presents with 2-day history of congestion, runny nose, cough.  He denies fever, chills, wheezing, shortness of breath.  Treating symptoms with Mucinex.  His medical history includes multiple sclerosis, asthma, prediabetes, morbid obesity.  The history is provided by the patient and medical records.    Past Medical History:  Diagnosis Date   Asthma    MS (multiple sclerosis) (HCC)    Prediabetes     Patient Active Problem List   Diagnosis Date Noted   Depression    MDD (major depressive disorder), recurrent episode, severe (HCC) 02/24/2021   Weakness 05/31/2020   Fasciculation 05/31/2020   Obesity, Class III, BMI 40-49.9 (morbid obesity) (HCC) 05/31/2020   Multiple sclerosis (HCC)    Left facial numbness     No past surgical history on file.     Home Medications    Prior to Admission medications   Medication Sig Start Date End Date Taking? Authorizing Provider  albuterol  (VENTOLIN  HFA) 108 (90 Base) MCG/ACT inhaler Inhale 2 puffs into the lungs every 4 (four) hours as needed for wheezing or shortness of breath. 09/27/22   White, Shelba SAUNDERS, NP  chlorproMAZINE  (THORAZINE ) 10 MG tablet Take 1 tablet (10 mg total) by mouth once as needed for hiccoughs. 06/03/20   Patel, Sona, MD  KESIMPTA 20 MG/0.4ML SOAJ Inject 20 mg into the skin every 30 (thirty) days. 02/03/21   [provider]  loratadine (CLARITIN) 10 MG tablet Take 10 mg by mouth daily as needed for allergies.    [provider]    Family History Family History  Family history unknown: Yes    Social History Social History   Tobacco Use   Smoking status: Never   Smokeless tobacco: Never  Substance Use Topics   Alcohol use: No    Alcohol/week: 0.0  standard drinks of alcohol   Drug use: Never     Allergies   Multihance  [gadobenate]   Review of Systems Review of Systems  Constitutional:  Negative for chills and fever.  HENT:  Positive for congestion and rhinorrhea. Negative for ear pain and sore throat.   Respiratory:  Positive for cough. Negative for shortness of breath and wheezing.      Physical Exam Triage Vital Signs ED Triage Vitals  Encounter Vitals Group     BP      Systolic BP Percentile      Diastolic BP Percentile      Pulse      Resp      Temp      Temp src      SpO2      Weight      Height      Head Circumference      Peak Flow      Pain Score      Pain Loc      Pain Education      Exclude from Growth Chart    No data found.  Updated Vital Signs BP 116/73   Pulse 100   Temp 99 F (37.2 C)   Resp 18   SpO2 96%   Visual Acuity Right Eye Distance:   Left Eye Distance:   Bilateral Distance:  Right Eye Near:   Left Eye Near:    Bilateral Near:     Physical Exam Constitutional:      General: He is not in acute distress. HENT:     Right Ear: Tympanic membrane normal.     Left Ear: Tympanic membrane normal.     Nose: Rhinorrhea present.     Mouth/Throat:     Mouth: Mucous membranes are moist.     Pharynx: Oropharynx is clear.  Cardiovascular:     Rate and Rhythm: Normal rate and regular rhythm.     Heart sounds: Normal heart sounds.  Pulmonary:     Effort: Pulmonary effort is normal. No respiratory distress.     Breath sounds: Normal breath sounds. No wheezing.  Neurological:     Mental Status: He is alert.      UC Treatments / Results  Labs (all labs ordered are listed, but only abnormal results are displayed) Labs Reviewed - No data to display  EKG   Radiology No results found.  Procedures Procedures (including critical care time)  Medications Ordered in UC Medications - No data to display  Initial Impression / Assessment and Plan / UC Course  I have  reviewed the triage vital signs and the nursing notes.  Pertinent labs & imaging results that were available during my care of the patient were reviewed by me and considered in my medical decision making (see chart for details).   Viral URI.  Afebrile and vital signs are stable.  Lungs are clear and O2 sat is 96% on room air.  Patient declines COVID or flu test.  He states he needs a note for work.  Discussed symptomatic treatment including Tylenol or ibuprofen  as needed.  Plain Mucinex as needed.  Instructed patient to follow-up with his PCP if he is not improving.  ED precautions given.  He agrees to plan of care.   Final Clinical Impressions(s) / UC Diagnoses   Final diagnoses:  Viral URI     Discharge Instructions      Take Tylenol or ibuprofen  as needed for fever or discomfort.  Take plain Mucinex as needed for congestion.  Rest and keep yourself hydrated.    Follow-up with your primary care provider if your symptoms are not improving.         ED Prescriptions   None    PDMP not reviewed this encounter.   Corlis Burnard DEL, NP 02/11/23 [id number]
